# Patient Record
Sex: Male | Born: 1955 | Race: White | Hispanic: No | Marital: Single | Smoking: Former smoker
Health system: Southern US, Community
[De-identification: ages and names within clinical notes are randomized; demographics above are authoritative.]

## PROBLEM LIST (undated history)

## (undated) ENCOUNTER — Emergency Department (HOSPITAL_COMMUNITY)
Admission: EM | Discharge status: Against Medical Advice | Payer: PRIVATE HEALTH INSURANCE | Attending: Emergency Medicine | Admitting: Emergency Medicine

## (undated) DIAGNOSIS — R739 Hyperglycemia, unspecified: Secondary | ICD-10-CM

## (undated) DIAGNOSIS — B029 Zoster without complications: Secondary | ICD-10-CM

## (undated) DIAGNOSIS — E785 Hyperlipidemia, unspecified: Secondary | ICD-10-CM

## (undated) DIAGNOSIS — I10 Essential (primary) hypertension: Secondary | ICD-10-CM

## (undated) DIAGNOSIS — A048 Other specified bacterial intestinal infections: Secondary | ICD-10-CM

## (undated) DIAGNOSIS — N529 Male erectile dysfunction, unspecified: Secondary | ICD-10-CM

## (undated) DIAGNOSIS — K635 Polyp of colon: Secondary | ICD-10-CM

## (undated) DIAGNOSIS — N451 Epididymitis: Secondary | ICD-10-CM

## (undated) DIAGNOSIS — K2970 Gastritis, unspecified, without bleeding: Secondary | ICD-10-CM

## (undated) DIAGNOSIS — M1990 Unspecified osteoarthritis, unspecified site: Secondary | ICD-10-CM

## (undated) DIAGNOSIS — F40240 Claustrophobia: Secondary | ICD-10-CM

## (undated) DIAGNOSIS — E039 Hypothyroidism, unspecified: Secondary | ICD-10-CM

## (undated) DIAGNOSIS — G4730 Sleep apnea, unspecified: Secondary | ICD-10-CM

## (undated) DIAGNOSIS — E7800 Pure hypercholesterolemia, unspecified: Secondary | ICD-10-CM

## (undated) HISTORY — DX: Hyperglycemia, unspecified: R73.9

## (undated) HISTORY — DX: Pure hypercholesterolemia, unspecified: E78.00

## (undated) HISTORY — PX: FRACTURE SURGERY: SHX138

## (undated) HISTORY — PX: TONSILLECTOMY: SHX5217

## (undated) HISTORY — DX: Gilbert syndrome: E80.4

## (undated) HISTORY — DX: Male erectile dysfunction, unspecified: N52.9

## (undated) HISTORY — DX: Claustrophobia: F40.240

## (undated) HISTORY — DX: Unspecified osteoarthritis, unspecified site: M19.90

## (undated) HISTORY — DX: Epididymitis: N45.1

## (undated) HISTORY — PX: OTHER SURGICAL HISTORY: SHX169

## (undated) HISTORY — DX: Hypothyroidism, unspecified: E03.9

## (undated) HISTORY — DX: Sleep apnea, unspecified: G47.30

## (undated) HISTORY — DX: Gastritis, unspecified, without bleeding: K29.70

## (undated) HISTORY — DX: Polyp of colon: K63.5

## (undated) HISTORY — PX: VENTRAL HERNIA REPAIR: SHX424

## (undated) HISTORY — DX: Hyperlipidemia, unspecified: E78.5

## (undated) HISTORY — DX: Other specified bacterial intestinal infections: A04.8

## (undated) HISTORY — DX: Zoster without complications: B02.9

---

## 2000-05-19 ENCOUNTER — Encounter: Payer: Self-pay | Admitting: Cardiology

## 2000-06-12 HISTORY — PX: OTHER SURGICAL HISTORY: SHX169

## 2004-04-30 ENCOUNTER — Ambulatory Visit: Payer: Self-pay | Admitting: Family Medicine

## 2004-04-30 LAB — CONVERTED CEMR LAB: Hgb A1c MFr Bld: 5.4 %

## 2004-05-04 ENCOUNTER — Ambulatory Visit: Payer: Self-pay | Admitting: Family Medicine

## 2004-10-25 ENCOUNTER — Encounter: Payer: Self-pay | Admitting: Family Medicine

## 2004-10-25 LAB — CONVERTED CEMR LAB: PSA: 0.57 ng/mL

## 2004-10-28 ENCOUNTER — Ambulatory Visit: Payer: Self-pay | Admitting: Family Medicine

## 2004-11-02 ENCOUNTER — Ambulatory Visit: Payer: Self-pay | Admitting: Family Medicine

## 2005-01-14 ENCOUNTER — Ambulatory Visit: Payer: Self-pay | Admitting: Family Medicine

## 2006-01-06 ENCOUNTER — Ambulatory Visit: Payer: Self-pay | Admitting: Family Medicine

## 2006-01-10 ENCOUNTER — Ambulatory Visit: Payer: Self-pay | Admitting: Family Medicine

## 2006-07-11 ENCOUNTER — Ambulatory Visit: Payer: Self-pay | Admitting: Family Medicine

## 2006-07-13 ENCOUNTER — Ambulatory Visit: Payer: Self-pay | Admitting: Cardiology

## 2006-07-25 ENCOUNTER — Ambulatory Visit: Payer: Self-pay | Admitting: Family Medicine

## 2006-07-26 ENCOUNTER — Ambulatory Visit: Payer: Self-pay

## 2006-07-26 HISTORY — PX: OTHER SURGICAL HISTORY: SHX169

## 2007-01-15 ENCOUNTER — Ambulatory Visit: Payer: Self-pay | Admitting: Family Medicine

## 2007-01-15 DIAGNOSIS — A048 Other specified bacterial intestinal infections: Secondary | ICD-10-CM | POA: Insufficient documentation

## 2007-01-15 DIAGNOSIS — K2990 Gastroduodenitis, unspecified, without bleeding: Secondary | ICD-10-CM

## 2007-01-15 DIAGNOSIS — K2970 Gastritis, unspecified, without bleeding: Secondary | ICD-10-CM | POA: Insufficient documentation

## 2007-01-15 DIAGNOSIS — R7309 Other abnormal glucose: Secondary | ICD-10-CM | POA: Insufficient documentation

## 2007-01-15 LAB — CONVERTED CEMR LAB
ALT: 23 units/L (ref 0–53)
AST: 18 units/L (ref 0–37)
Albumin: 4.3 g/dL (ref 3.5–5.2)
BUN: 17 mg/dL (ref 6–23)
Bilirubin, Direct: 0.3 mg/dL (ref 0.0–0.3)
Calcium: 8.8 mg/dL (ref 8.4–10.5)
Chloride: 107 meq/L (ref 96–112)
GFR calc Af Amer: 91 mL/min
GFR calc non Af Amer: 75 mL/min
Glucose, Bld: 112 mg/dL — ABNORMAL HIGH (ref 70–99)
LDL Cholesterol: 132 mg/dL — ABNORMAL HIGH (ref 0–99)
TSH: 4.9 microintl units/mL (ref 0.35–5.50)
Total CHOL/HDL Ratio: 4.4
VLDL: 17 mg/dL (ref 0–40)

## 2007-01-16 DIAGNOSIS — E039 Hypothyroidism, unspecified: Secondary | ICD-10-CM | POA: Insufficient documentation

## 2007-01-17 ENCOUNTER — Ambulatory Visit: Payer: Self-pay | Admitting: Family Medicine

## 2007-01-30 ENCOUNTER — Ambulatory Visit: Payer: Self-pay | Admitting: Family Medicine

## 2007-01-31 ENCOUNTER — Encounter (INDEPENDENT_AMBULATORY_CARE_PROVIDER_SITE_OTHER): Payer: Self-pay | Admitting: *Deleted

## 2007-02-12 ENCOUNTER — Ambulatory Visit: Payer: Self-pay | Admitting: Family Medicine

## 2007-02-19 ENCOUNTER — Ambulatory Visit: Payer: Self-pay | Admitting: Internal Medicine

## 2007-03-22 ENCOUNTER — Encounter: Payer: Self-pay | Admitting: Internal Medicine

## 2007-03-22 ENCOUNTER — Ambulatory Visit: Payer: Self-pay | Admitting: Internal Medicine

## 2007-03-22 ENCOUNTER — Encounter: Payer: Self-pay | Admitting: Family Medicine

## 2007-03-22 HISTORY — PX: COLONOSCOPY: SHX174

## 2007-09-17 ENCOUNTER — Ambulatory Visit: Payer: Self-pay | Admitting: Family Medicine

## 2007-09-17 DIAGNOSIS — F40298 Other specified phobia: Secondary | ICD-10-CM | POA: Insufficient documentation

## 2007-09-17 DIAGNOSIS — N529 Male erectile dysfunction, unspecified: Secondary | ICD-10-CM | POA: Insufficient documentation

## 2007-09-27 DIAGNOSIS — D126 Benign neoplasm of colon, unspecified: Secondary | ICD-10-CM | POA: Insufficient documentation

## 2008-01-18 ENCOUNTER — Ambulatory Visit: Payer: Self-pay | Admitting: Family Medicine

## 2008-01-18 LAB — CONVERTED CEMR LAB
Basophils Absolute: 0 10*3/uL (ref 0.0–0.1)
Basophils Relative: 0.7 % (ref 0.0–3.0)
Bilirubin, Direct: 0.2 mg/dL (ref 0.0–0.3)
Calcium: 8.9 mg/dL (ref 8.4–10.5)
Cholesterol: 167 mg/dL (ref 0–200)
Creatinine, Ser: 0.9 mg/dL (ref 0.4–1.5)
Creatinine,U: 139.5 mg/dL
Eosinophils Absolute: 0.3 10*3/uL (ref 0.0–0.7)
Free T4: 0.7 ng/dL (ref 0.6–1.6)
GFR calc non Af Amer: 95 mL/min
Hemoglobin: 14.3 g/dL (ref 13.0–17.0)
LDL Cholesterol: 110 mg/dL — ABNORMAL HIGH (ref 0–99)
MCHC: 35 g/dL (ref 30.0–36.0)
MCV: 91 fL (ref 78.0–100.0)
Microalb Creat Ratio: 5.7 mg/g (ref 0.0–30.0)
Microalb, Ur: 0.8 mg/dL (ref 0.0–1.9)
Neutro Abs: 1.9 10*3/uL (ref 1.4–7.7)
Neutrophils Relative %: 50.9 % (ref 43.0–77.0)
PSA: 0.66 ng/mL (ref 0.10–4.00)
RBC: 4.49 M/uL (ref 4.22–5.81)
RDW: 12.9 % (ref 11.5–14.6)
Sodium: 140 meq/L (ref 135–145)
Total Bilirubin: 2.1 mg/dL — ABNORMAL HIGH (ref 0.3–1.2)
Triglycerides: 52 mg/dL (ref 0–149)
VLDL: 10 mg/dL (ref 0–40)

## 2008-01-24 ENCOUNTER — Ambulatory Visit: Payer: Self-pay | Admitting: Family Medicine

## 2008-04-21 ENCOUNTER — Ambulatory Visit: Payer: Self-pay | Admitting: Family Medicine

## 2008-04-21 DIAGNOSIS — M549 Dorsalgia, unspecified: Secondary | ICD-10-CM | POA: Insufficient documentation

## 2008-12-02 DIAGNOSIS — R079 Chest pain, unspecified: Secondary | ICD-10-CM | POA: Insufficient documentation

## 2009-02-09 ENCOUNTER — Ambulatory Visit: Payer: Self-pay | Admitting: Family Medicine

## 2009-02-09 LAB — CONVERTED CEMR LAB
ALT: 20 units/L (ref 0–53)
BUN: 15 mg/dL (ref 6–23)
Basophils Absolute: 0 10*3/uL (ref 0.0–0.1)
Calcium: 9.2 mg/dL (ref 8.4–10.5)
Cholesterol: 185 mg/dL (ref 0–200)
Creatinine, Ser: 1 mg/dL (ref 0.4–1.5)
Eosinophils Absolute: 0.3 10*3/uL (ref 0.0–0.7)
GFR calc non Af Amer: 83.11 mL/min (ref >60)
HDL: 51.8 mg/dL (ref >39.00)
Hemoglobin: 14.4 g/dL (ref 13.0–17.0)
LDL Cholesterol: 123 mg/dL — ABNORMAL HIGH (ref 0–99)
Lymphocytes Relative: 41.2 % (ref 12.0–46.0)
Lymphs Abs: 1.6 10*3/uL (ref 0.7–4.0)
MCHC: 34.1 g/dL (ref 30.0–36.0)
Neutro Abs: 1.7 10*3/uL (ref 1.4–7.7)
RDW: 13.1 % (ref 11.5–14.6)
TSH: 10.89 microintl units/mL — ABNORMAL HIGH (ref 0.35–5.50)
Total Bilirubin: 2.1 mg/dL — ABNORMAL HIGH (ref 0.3–1.2)
Triglycerides: 52 mg/dL (ref 0.0–149.0)

## 2009-02-16 ENCOUNTER — Ambulatory Visit: Payer: Self-pay | Admitting: Family Medicine

## 2009-05-25 ENCOUNTER — Ambulatory Visit: Payer: Self-pay | Admitting: Family Medicine

## 2009-05-28 ENCOUNTER — Ambulatory Visit: Payer: Self-pay | Admitting: Family Medicine

## 2009-05-28 DIAGNOSIS — M79609 Pain in unspecified limb: Secondary | ICD-10-CM | POA: Insufficient documentation

## 2009-07-22 ENCOUNTER — Telehealth: Payer: Self-pay | Admitting: Family Medicine

## 2009-08-24 ENCOUNTER — Ambulatory Visit: Payer: Self-pay | Admitting: Family Medicine

## 2009-08-24 LAB — CONVERTED CEMR LAB: TSH: 1.82 microintl units/mL (ref 0.35–5.50)

## 2009-08-27 ENCOUNTER — Ambulatory Visit: Payer: Self-pay | Admitting: Family Medicine

## 2009-10-29 ENCOUNTER — Ambulatory Visit: Payer: Self-pay | Admitting: Family Medicine

## 2009-10-29 DIAGNOSIS — R198 Other specified symptoms and signs involving the digestive system and abdomen: Secondary | ICD-10-CM | POA: Insufficient documentation

## 2009-10-29 LAB — CONVERTED CEMR LAB
ALT: 21 units/L (ref 0–53)
Albumin: 4.6 g/dL (ref 3.5–5.2)
Basophils Relative: 0.5 % (ref 0.0–3.0)
CO2: 32 meq/L (ref 19–32)
Chloride: 103 meq/L (ref 96–112)
Eosinophils Absolute: 0.1 10*3/uL (ref 0.0–0.7)
HCT: 44.5 % (ref 39.0–52.0)
Hemoglobin: 15.2 g/dL (ref 13.0–17.0)
MCHC: 34.2 g/dL (ref 30.0–36.0)
MCV: 90.9 fL (ref 78.0–100.0)
Monocytes Absolute: 0.3 10*3/uL (ref 0.1–1.0)
Neutro Abs: 2.4 10*3/uL (ref 1.4–7.7)
RBC: 4.9 M/uL (ref 4.22–5.81)
Sodium: 142 meq/L (ref 135–145)

## 2009-11-12 ENCOUNTER — Ambulatory Visit: Payer: Self-pay | Admitting: Internal Medicine

## 2009-11-19 ENCOUNTER — Telehealth: Payer: Self-pay | Admitting: Family Medicine

## 2009-12-30 ENCOUNTER — Telehealth: Payer: Self-pay | Admitting: Family Medicine

## 2010-03-08 ENCOUNTER — Telehealth: Payer: Self-pay | Admitting: Family Medicine

## 2010-07-27 NOTE — Assessment & Plan Note (Signed)
Summary: DISCUSS WEIGHT LOSS AND MEDICATION/DLO   Vital Signs:  Patient profile:   55 year old male Weight:      149.75 pounds Temp:     98.6 degrees F oral Pulse rate:   68 / minute Pulse rhythm:   regular BP sitting:   122 / 88  (left arm) Cuff size:   regular  Vitals Entered By: Sydell Axon LPN (Oct 29, 8117 10:10 AM) CC: Discuss his weight loss and current medications   History of Present Illness: Since seeing me last time, has started losing weight. He had early satiety but that is now better. He has lost 16 pounds. He has no abd pain. He got separated again and has anxiety and continues. He is heading for divorce, her input, doesn't know if she is involved. Holidays were nml to him and the [place] after, she left and had arrangements in place. He is sleeping ok, he feels tired some days. He sees no blood in stool, is brown and formed and has had regularity of BMs.   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 One Tab By Mouth Once Daily 2)  Viagra 100 Mg  Tabs (Sildenafil Citrate) .... One Tab By Mouth One Hour Prior As Needed.  Allergies: No Known Drug Allergies  Physical Exam  General:   Well-developed,well-nourished,in no acute distress; alert,appropriate and cooperative throughout examination, looks thin. Head:  Normocephalic and atraumatic without obvious abnormalities. No apparent alopecia but mild receding hairline of male pattern balding. Sinuses NT. Eyes:  Conjunctiva clear bilaterally.  Ears:  External ear exam shows no significant lesions or deformities.  Otoscopic examination reveals clear canals, tympanic membranes are intact bilaterally without bulging, retraction, inflammation or discharge. Hearing is grossly normal bilaterally. Nose:  External nasal examination shows no deformity or inflammation. Nasal mucosa are pink and moist without lesions or exudates. Mouth:  Oral mucosa and oropharynx without lesions or exudates.  Teeth in good repair. Neck:  No deformities, masses, or tenderness noted. Chest Wall:  No deformities, masses, tenderness or gynecomastia noted. Lungs:  Normal respiratory effort, chest expands symmetrically. Lungs are clear to auscultation, no crackles or wheezes. Heart:  Normal rate and regular rhythm. S1 and S2 normal without gallop, murmur, click, rub or other extra sounds. Abdomen:  Bowel sounds positive,abdomen soft and non-tender without masses, organomegaly or hernias noted.   Impression & Recommendations:  Problem # 1:  WEIGHT LOSS, RECENT (ICD-783.21) Assessment New [place] be all anxiety and depression but will investigate. Labs today, refer to GI. Orders: Gastroenterology Referral (GI) Venipuncture (14782) TLB-BMP (Basic Metabolic Panel-BMET) (80048-METABOL) TLB-CBC Platelet - w/Differential (85025-CBCD) TLB-TSH (Thyroid Stimulating Hormone) (84443-TSH) TLB-Hepatic/Liver Function Pnl (80076-HEPATIC)  Problem # 2:  EARLY SATIETY (ICD-789.9) Assessment: New Refer to GI. Orders: Gastroenterology Referral (  GI) Venipuncture (52841) TLB-BMP (Basic Metabolic Panel-BMET) (80048-METABOL) TLB-CBC Platelet - w/Differential  (85025-CBCD) TLB-TSH (Thyroid Stimulating Hormone) (84443-TSH) TLB-Hepatic/Liver Function Pnl (80076-HEPATIC)  Complete Medication List: 1)  Synthroid 175 Mcg Tabs (Levothyroxine sodium) .... One tab by mouth once daily 2)  Viagra 100 Mg Tabs (Sildenafil citrate) .... One tab by mouth one hour prior as needed.  Patient Instructions: 1)  Refer to GI 2)  Will report labs via phone tree unless abnml. 3)  See me after GI complete.  Current Allergies (reviewed today): No known allergies

## 2010-07-27 NOTE — Progress Notes (Signed)
Summary: wants to try cialis  Phone Note Call from Patient Call back at [phone]   Caller: Patient Summary of Call: Pt has been using viagra for a couple of years but would like to try daily cialis because he has a coupon.  He wants a 30 day supply sent to [facility] in [place]. Initial call taken by: Lowella Petties CMA,  December 30, 2009 12:35 PM  Follow-up for Phone Call        please send in rx for cialis 5mg  by mouth qday.  #30, 1rf.  Do not use with viagra.  Follow-up by: Crawford Givens MD,  December 30, 2009 2:01 PM    New/Updated Medications: CIALIS 5 MG TABS (TADALAFIL) Take 1 tablet by mouth once a day Prescriptions: CIALIS 5 MG TABS (TADALAFIL) Take 1 tablet by mouth once a day  #30 x 1   Entered by:   Delilah Shan CMA (AAMA)   Authorized by:   Crawford Givens MD   Signed by:   Delilah Shan CMA (AAMA) on 12/30/2009   Method used:   Electronically to        [facility] [address] (retail)       [address] [place], [place]  [address]       Ph: [phone]       Fax: [phone]   RxID:   [id number]

## 2010-07-27 NOTE — Progress Notes (Signed)
Summary: Rx Viagra  Phone Note Call from Patient Call back at [phone]   Caller: Patient Call For: Shaune Leeks MD Summary of Call: Patient called requesting a Rx for Viagra.  Please advise.  [facility]ses [facility]. Initial call taken by: Linde Gillis CMA Duncan Dull),  July 22, 2009 2:27 PM  Follow-up for Phone Call        Patient notified Follow-up by: Linde Gillis CMA Duncan Dull),  July 22, 2009 3:18 PM    Prescriptions: VIAGRA 100 MG  TABS (SILDENAFIL CITRATE) one tab by mouth one hour prior as needed.  #10 x 12   Entered and Authorized by:   Shaune Leeks MD   Signed by:   Shaune Leeks MD on 07/22/2009   Method used:   Electronically to        [facility] [address] (retail)       [address] [place], [place]  [address]       Ph: [phone]       Fax: [phone]   RxID:   [id number]

## 2010-07-27 NOTE — Progress Notes (Signed)
Summary: refill request for cialis  Phone Note Refill Request Message from:  Fax from Pharmacy  Refills Requested: Medication #1:  CIALIS 5 MG TABS Take 1 tablet by mouth once a day.   Last Refilled: 01/30/2010 Faxed request from [facility] [place].  Initial call taken by: Lowella Petties CMA,  March 08, 2010 11:17 AM  Follow-up for Phone Call       Follow-up by: Crawford Givens MD,  March 08, 2010 1:56 PM    Prescriptions: CIALIS 5 MG TABS (TADALAFIL) Take 1 tablet by mouth once a day  #30 x 5   Entered and Authorized by:   Crawford Givens MD   Signed by:   Crawford Givens MD on 03/08/2010   Method used:   Electronically to        [facility] [address] (retail)       [address] [place], [place]  [address]       Ph: [phone]       Fax: [phone]   RxID:   [id number]

## 2010-07-27 NOTE — Assessment & Plan Note (Signed)
Summary: WEIGHT LOSS    History of Present Illness Visit Type: Follow-up Visit Primary GI MD: Yancey Flemings MD Primary Provider: Laurita Quint, MD Requesting Provider: Laurita Quint, MD Chief Complaint: Weight loss x 1 month History of Present Illness:   55 year old white male with a history of hyperlipidemia, hypothyroidism, and Gilbert's syndrome. He is sent today regarding weight loss. The patient was evaluated in 2008 4 low her abdominal discomfort and consideration of colonoscopy. Complete colonoscopy was performed March 22, 2007. The examination was entirely normal except for diminutive sigmoid colon polyps which were removed and found to be hyperplastic. Follow up in 10 years recommended. The patient states that he was in his usual state of health until January when he came under considerable stress do to the separation with his wife. Stress continued. He reports 16 pound weight loss in March and April. He denies change in appetite. There was some mild intermittent early satiety. GI review of systems is otherwise negative. No back pain. His thyroid condition is stable. Review of outside blood work from 2 weeks ago reveals unremarkable CBC and conference of metabolic panel. Elevated indirect bilirubin as expected. Normal TSH. He tells me that over the past few weeks his situational issues have improved. He is asked again for pounds over the past week.   GI Review of Systems    Reports weight loss and  weight gain.   Weight loss of 16 pounds over 2 months.   Denies abdominal pain, acid reflux, belching, bloating, chest pain, dysphagia with liquids, dysphagia with solids, heartburn, loss of appetite, vomiting, and  vomiting blood.        Denies anal fissure, black tarry stools, change in bowel habit, constipation, diarrhea, diverticulosis, fecal incontinence, heme positive stool, hemorrhoids, irritable bowel syndrome, jaundice, light color stool, liver problems, rectal bleeding, and   rectal pain.    Current Medications (verified): 1)  Synthroid 175 Mcg Tabs (Levothyroxine Sodium) .... One Tab By Mouth Once Daily 2)  Viagra 100 Mg  Tabs (Sildenafil Citrate) .... One Tab By Mouth One Hour Prior As Needed.  Allergies (verified): No Known Drug Allergies  Past History:  Past Medical History: Reviewed history from 12/02/2008 and no changes required. 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 Ventral Hernia Repair  as child Tonsillectomy  as child Fx L Leg as child HOSP Gastritis in [place] ([place] ETT, negative ([facility]) 06/12/00 Stress myoview, NSST/T W changes, low risk 07/26/06 Colonoscopy Benign polyps (Dr Marina Goodell) 03/22/07                 10 yrs  Family History: Reviewed history from 02/16/2009 and no changes required. Father: A  54 Mother: A 71 osteoporosis, DM Siblings: Only child CV- + PGF MI HBP- + Mother's side + Stroke mother's side  Social History: Reviewed history from 12/02/2008 and no changes required. Marital Status: Married Children: One daughter Occupation: Manufacturing systems engineer with [employer] Tobacco Use - Former.  - 1988 Alcohol Use - yes Regular Exercise - yes  Drug Use - yes last in '76  Review of Systems       The patient complains of anxiety-new.  The patient denies allergy/sinus,  anemia, arthritis/joint pain, back pain, blood in urine, breast changes/lumps, change in vision, confusion,  cough, coughing up blood, depression-new, fainting, fatigue, fever, headaches-new, hearing problems, heart murmur, heart rhythm changes, itching, menstrual pain, muscle pains/cramps, night sweats, nosebleeds, pregnancy symptoms, shortness of breath, skin rash, sleeping problems, sore throat, swelling of feet/legs, swollen lymph glands, thirst - excessive , urination - excessive , urination changes/pain, urine leakage, vision changes, and voice change.    Vital Signs:  Patient profile:   55 year old male Height:      67 inches Weight:      151.25 pounds BMI:     23.77 Pulse rate:   68 / minute Pulse rhythm:   regular BP sitting:   116 / 80  (left arm) Cuff size:   regular  Vitals Entered By: June McMurray CMA Duncan Dull) (Nov 12, 2009 10:02 AM)  Physical Exam  General:  Well developed, well nourished, no acute distress. Head:  Normocephalic and atraumatic. Eyes:  PERRLA, no icterus. Ears:  Normal auditory acuity. Nose:  No deformity, discharge,  or lesions. Mouth:  No deformity or lesions, dentition normal. Neck:  Supple; no masses or thyromegaly. Lungs:  Clear throughout to auscultation. Heart:  Regular rate and rhythm; no murmurs, rubs,  or bruits. Abdomen:  Soft, nontender and nondistended. No masses, hepatosplenomegaly or hernias noted. Normal bowel sounds. Msk:  Symmetrical with no gross deformities. Normal posture. Pulses:  Normal pulses noted. Extremities:  No clubbing, cyanosis, edema or deformities noted. Neurologic:  Alert and  oriented x4;  grossly normal neurologically. Skin:  Intact without significant lesions or rashes. Psych:  Alert and cooperative. Normal mood and affect.   Impression & Recommendations:  Problem # 1:  WEIGHT LOSS, RECENT (ICD-69.75) 55 year-old who presents with weight loss as described. His GI review of systems  negative. The most likely explanation is situational weight loss related to anxiety. With improving personal issues, he has had some weight  gain. Previous negative colonoscopy and recent laboratories as described. At this point, I do not think further workup is warranted, assuming that the patient continues to be asymptomatic and continues to gain, or at least not lose, weight. I discussed this with him in detail. I will assume that he is doing well unless I hear otherwise.  Patient Instructions: 1)  Please schedule a follow-up appointment as needed.  2)  Copy sent to : Laurita Quint, MD 3)  The medication list was reviewed and reconciled.  All changed / newly prescribed medications were explained.  A complete medication list was provided to the patient / caregiver. 4)  printed and given to pt. Milford Cage NCMA  Nov 12, 2009 10:54 AM

## 2010-07-27 NOTE — Assessment & Plan Note (Signed)
Summary: 3 MONTH FOLLOW UP/[facility]   Vital Signs:  Patient profile:   55 year old male Weight:      162 pounds Temp:     98.9 degrees F oral Pulse rate:   60 / minute Pulse rhythm:   regular BP sitting:   140 / 84  (left arm) Cuff size:   regular  Vitals Entered By: Sydell Axon LPN (August 27, 452 11:36 AM) CC: 3 Month follow-up after labs   History of Present Illness: Pt here for another three month followup after having increased his thyroid replacement now 6 months ago. He feel slightly better and has no complaints today. His TSH was still elevated three months agao after increasing his medicine 3 mos prior.   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 One Tab By Mouth Once Daily 2)  Viagra 100 Mg  Tabs (Sildenafil Citrate) .... One Tab By Mouth One Hour Prior As Needed.  Allergies: No Known Drug Allergies  Physical Exam  General:  Well-developed,well-nourished,in no acute distress; alert,appropriate and cooperative throughout examination Head:  Normocephalic and atraumatic without obvious abnormalities. No apparent alopecia but mild receding hairline of male pattern balding. Eyes:  Conjunctiva clear bilaterally.   Ears:  External ear exam shows no significant lesions or deformities.  Otoscopic examination reveals clear canals, tympanic membranes are intact bilaterally without bulging, retraction, inflammation or discharge. Hearing is grossly normal bilaterally. Nose:  External nasal examination shows no deformity or inflammation. Nasal mucosa are pink and moist without lesions or exudates. Mouth:  Oral mucosa and oropharynx without lesions or exudates.  Teeth in good repair. Neck:  No deformities, masses, or tenderness noted. Chest Wall:  No deformities, masses, tenderness or gynecomastia noted. Lungs:  Normal respiratory effort, chest expands symmetrically. Lungs are clear to auscultation, no crackles or wheezes. Heart:  Normal rate and regular rhythm. S1 and S2 normal without gallop, murmur, click, rub or other extra sounds.   Impression & Recommendations:  Problem # 1:  HYPOTHYROIDISM NOS (ICD-244.9) Assessment Improved Therapeutic on current dose after 6 mos. Cont as prescribed. His updated medication list for this problem includes:    Synthroid 175 Mcg Tabs (Levothyroxine sodium) ..... One tab by mouth once daily  Labs Reviewed: TSH: 1.82 (08/24/2009)    HgBA1c: 5.4 (04/30/2004) Chol: 185 (02/09/2009)   HDL: 51.80 (02/09/2009)   LDL: 123 (02/09/2009)   TG: 52.0 (02/09/2009)  Complete Medication List: 1)  Synthroid 175 Mcg Tabs (Levothyroxine sodium) .... One tab by mouth once daily 2)  Viagra 100 Mg Tabs (Sildenafil citrate) .... One tab by mouth one hour prior as needed.  Patient Instructions: 1)  RTC about this time next year for Comp Exam, fasting labs prior.  Current Allergies (reviewed today): No known allergies

## 2010-07-27 NOTE — Progress Notes (Signed)
Summary: refill request for alprazolam  Phone Note Refill Request Message from:  Fax from Pharmacy  Refills Requested: Medication #1:  alprazolam 0.5 mg   Last Refilled: 09/17/2007 Faxed request from [facility] in [place], this is no longer on med list.  [phone].  Initial call taken by: Lowella Petties CMA,  Nov 19, 2009 10:42 AM  Follow-up for Phone Call        Called to [facility]. Follow-up by: Lowella Petties CMA,  Nov 19, 2009 4:51 PM    New/Updated Medications: ALPRAZOLAM 0.5 MG XR24H-TAB (ALPRAZOLAM) one tab by mouth once a day as needed for agitation. Prescriptions: ALPRAZOLAM 0.5 MG XR24H-TAB (ALPRAZOLAM) one tab by mouth once a day as needed for agitation.  #30 x 0   Entered and Authorized by:   Shaune Leeks MD   Signed by:   Lowella Petties CMA on 11/19/2009   Method used:   Telephoned to ...       [facility] [address] (retail)       [address] [place], [place]  [address]       Ph: [phone]       Fax: [phone]   RxID:   [id number]

## 2010-08-04 ENCOUNTER — Encounter: Payer: Self-pay | Admitting: Family Medicine

## 2010-09-17 ENCOUNTER — Other Ambulatory Visit: Payer: Self-pay | Admitting: Family Medicine

## 2010-09-17 ENCOUNTER — Other Ambulatory Visit: Payer: Self-pay | Admitting: *Deleted

## 2010-09-17 DIAGNOSIS — K2970 Gastritis, unspecified, without bleeding: Secondary | ICD-10-CM

## 2010-09-17 DIAGNOSIS — D126 Benign neoplasm of colon, unspecified: Secondary | ICD-10-CM

## 2010-09-17 DIAGNOSIS — E039 Hypothyroidism, unspecified: Secondary | ICD-10-CM

## 2010-09-17 DIAGNOSIS — R079 Chest pain, unspecified: Secondary | ICD-10-CM

## 2010-09-17 DIAGNOSIS — R7309 Other abnormal glucose: Secondary | ICD-10-CM

## 2010-09-17 DIAGNOSIS — Z125 Encounter for screening for malignant neoplasm of prostate: Secondary | ICD-10-CM

## 2010-09-17 DIAGNOSIS — K2990 Gastroduodenitis, unspecified, without bleeding: Secondary | ICD-10-CM

## 2010-09-17 NOTE — Patient Instructions (Signed)
Orders sent to lab for bloodwork.

## 2010-09-20 ENCOUNTER — Other Ambulatory Visit (INDEPENDENT_AMBULATORY_CARE_PROVIDER_SITE_OTHER): Payer: PRIVATE HEALTH INSURANCE | Admitting: Family Medicine

## 2010-09-20 DIAGNOSIS — Z0000 Encounter for general adult medical examination without abnormal findings: Secondary | ICD-10-CM

## 2010-09-20 DIAGNOSIS — M545 Low back pain, unspecified: Secondary | ICD-10-CM

## 2010-09-20 DIAGNOSIS — E039 Hypothyroidism, unspecified: Secondary | ICD-10-CM

## 2010-09-20 DIAGNOSIS — Z125 Encounter for screening for malignant neoplasm of prostate: Secondary | ICD-10-CM

## 2010-09-20 DIAGNOSIS — K2970 Gastritis, unspecified, without bleeding: Secondary | ICD-10-CM

## 2010-09-20 DIAGNOSIS — D126 Benign neoplasm of colon, unspecified: Secondary | ICD-10-CM

## 2010-09-20 DIAGNOSIS — R7309 Other abnormal glucose: Secondary | ICD-10-CM

## 2010-09-20 LAB — MICROALBUMIN / CREATININE URINE RATIO
Creatinine,U: 188.7 mg/dL
Microalb Creat Ratio: 3.7 mg/g (ref 0.0–30.0)
Microalb, Ur: 6.9 mg/dL — ABNORMAL HIGH (ref 0.0–1.9)

## 2010-09-20 LAB — LIPID PANEL
HDL: 56.5 mg/dL (ref >39.00)
LDL Cholesterol: 126 mg/dL — ABNORMAL HIGH (ref 0–99)
Total CHOL/HDL Ratio: 3
Triglycerides: 47 mg/dL (ref 0.0–149.0)
VLDL: 9.4 mg/dL (ref 0.0–40.0)

## 2010-09-20 LAB — TSH: TSH: 2.72 u[IU]/mL (ref 0.35–5.50)

## 2010-09-20 LAB — CBC WITH DIFFERENTIAL/PLATELET
Basophils Relative: 0.5 % (ref 0.0–3.0)
Eosinophils Relative: 5.3 % — ABNORMAL HIGH (ref 0.0–5.0)
Lymphocytes Relative: 27.6 % (ref 12.0–46.0)
Monocytes Relative: 6.2 % (ref 3.0–12.0)
Neutrophils Relative %: 60.4 % (ref 43.0–77.0)
Platelets: 235 10*3/uL (ref 150.0–400.0)
RBC: 4.77 Mil/uL (ref 4.22–5.81)
WBC: 5.4 10*3/uL (ref 4.5–10.5)

## 2010-09-20 LAB — BASIC METABOLIC PANEL
Chloride: 105 mEq/L (ref 96–112)
Creatinine, Ser: 0.9 mg/dL (ref 0.4–1.5)
GFR: 89.82 mL/min (ref >60.00)

## 2010-09-20 LAB — HEPATIC FUNCTION PANEL
ALT: 24 U/L (ref 0–53)
Bilirubin, Direct: 0.3 mg/dL (ref 0.0–0.3)
Total Bilirubin: 2.4 mg/dL — ABNORMAL HIGH (ref 0.3–1.2)

## 2010-09-20 LAB — T4, FREE: Free T4: 0.73 ng/dL (ref 0.60–1.60)

## 2010-09-20 NOTE — Progress Notes (Signed)
Addended byMills Koller on: 09/20/2010 09:49 AM   Modules accepted: Orders

## 2010-09-21 LAB — VITAMIN D 25 HYDROXY (VIT D DEFICIENCY, FRACTURES): Vit D, 25-Hydroxy: 36 ng/mL (ref 30–89)

## 2010-09-23 ENCOUNTER — Ambulatory Visit (INDEPENDENT_AMBULATORY_CARE_PROVIDER_SITE_OTHER): Payer: PRIVATE HEALTH INSURANCE | Admitting: Family Medicine

## 2010-09-23 ENCOUNTER — Encounter: Payer: Self-pay | Admitting: Family Medicine

## 2010-09-23 DIAGNOSIS — R7309 Other abnormal glucose: Secondary | ICD-10-CM

## 2010-09-23 DIAGNOSIS — R030 Elevated blood-pressure reading, without diagnosis of hypertension: Secondary | ICD-10-CM

## 2010-09-23 DIAGNOSIS — E039 Hypothyroidism, unspecified: Secondary | ICD-10-CM

## 2010-09-23 DIAGNOSIS — R079 Chest pain, unspecified: Secondary | ICD-10-CM

## 2010-09-23 DIAGNOSIS — I10 Essential (primary) hypertension: Secondary | ICD-10-CM | POA: Insufficient documentation

## 2010-09-23 DIAGNOSIS — IMO0001 Reserved for inherently not codable concepts without codable children: Secondary | ICD-10-CM

## 2010-09-23 NOTE — Assessment & Plan Note (Signed)
Bili still elevated, LFTs otherwise nml.

## 2010-09-23 NOTE — Patient Instructions (Signed)
RTC 6 weeks for reassessment of BP.  Avoid salt.

## 2010-09-23 NOTE — Progress Notes (Signed)
  Subjective:    Patient ID: Chad Avila, male    DOB: 1955/12/31, 55 y.o.   MRN: [id number]  HPI Pt here for Comp Exam. He got stuffiness after his flu shot ladst Fall which has never gone away. He otherwise feels well. He is very stiff nowadays with joints bothered the most.     Review of Systems  Constitutional: Negative for fever, chills, diaphoresis, appetite change, fatigue and unexpected weight change.  HENT: Negative for hearing loss, ear pain, tinnitus and ear discharge.   Eyes: Negative for pain, discharge and visual disturbance.  Respiratory: Negative for cough, shortness of breath and wheezing.   Cardiovascular: Negative for chest pain and palpitations.       No SOB w/ exertion  Gastrointestinal: Negative for nausea, vomiting, abdominal pain, diarrhea, constipation, blood in stool and anal bleeding.       No heartburn or swallowing problems.  Genitourinary: Negative for dysuria, frequency and difficulty urinating.       Nocturia a few times at night regularly.  Musculoskeletal: Negative for myalgias, back pain and arthralgias.  Skin: Negative for rash.       No itching or dryness.  Neurological: Negative for tremors and numbness.       No tingling or balance problems.  Hematological: Negative for adenopathy. Does not bruise/bleed easily.  Psychiatric/Behavioral: Negative for dysphoric mood and agitation.       Objective:   Physical Exam  Constitutional: He is oriented to person, place, and time. He appears well-developed and well-nourished. No distress.  HENT:  Head: Normocephalic and atraumatic.  Right Ear: External ear normal.  Left Ear: External ear normal.  Nose: Nose normal.  Mouth/Throat: Oropharynx is clear and moist.  Eyes: Conjunctivae and EOM are normal. Pupils are equal, round, and reactive to light. Right eye exhibits no discharge. Left eye exhibits no discharge. No scleral icterus.  Neck: Normal range of motion. Neck supple. No thyromegaly present.    Cardiovascular: Normal rate, regular rhythm, normal heart sounds and intact distal pulses.   No murmur heard. Pulmonary/Chest: Effort normal and breath sounds normal. No respiratory distress. He has no wheezes.  Abdominal: Soft. Bowel sounds are normal. He exhibits no distension and no mass. There is no tenderness. There is no rebound and no guarding.       Guaiac Neg.  Genitourinary: Rectum normal, prostate normal and penis normal. Guaiac negative stool.       Prostate 60gms, smooth.  Musculoskeletal: Normal range of motion. He exhibits no edema.  Lymphadenopathy:    He has no cervical adenopathy.  Neurological: He is alert and oriented to person, place, and time. Coordination normal.  Skin: Skin is warm and dry. No rash noted. He is not diaphoretic.  Psychiatric: He has a normal mood and affect. His behavior is normal. Judgment and thought content normal.          Assessment & Plan:  HMPE Trial of BP observation, avoiding salt and exercising to lose weight.

## 2010-09-23 NOTE — Assessment & Plan Note (Signed)
None recently. Seems stable. Is active, no CP w/ activity.

## 2010-09-23 NOTE — Assessment & Plan Note (Addendum)
Stable...euthyroid on current dose. Lab Results  Component Value Date   TSH 2.72 09/20/2010

## 2010-09-23 NOTE — Assessment & Plan Note (Signed)
BP up today. Avoid salt, be active and lose weight....will recheck in 6 weeks.

## 2010-09-23 NOTE — Assessment & Plan Note (Signed)
Still mildly elevated. Microalbumin nml.  Discussed avoiding sweets and carbs.

## 2010-11-09 NOTE — Assessment & Plan Note (Signed)
[facility]                         GASTROENTEROLOGY OFFICE NOTE   NAME:Chad Avila, Chad Avila                      MRN:          [id number]  DATE:02/19/2007                            DOB:          [birth year]    REASON FOR CONSULTATION:  Lower abdominal discomfort and colonoscopy.   HISTORY:  This is a pleasant 55 year old white male with a history of  hypothyroidism who is referred through the courtesy of Dr. Hetty Ely  regarding lower abdominal discomfort and screening colonoscopy.  The  patient reports a 22-month history of fairly constant low pelvic or groin  discomfort.  This is very low and seems worse in the morning or worse  with bending or exertion.  He was evaluated by Dr. Hetty Ely who  diagnosed epididymitis.  The patient was treated with ciprofloxacin for  about 3 weeks and reports his symptoms to be 70% better.  He denies  constipation, diarrhea, change in bowel habits, melena, hematochezia, or  weight loss.  He has not had prior screening colonoscopy.   PAST MEDICAL HISTORY:  1. Hypothyroidism.  2. Gilbert's syndrome.   PAST SURGICAL HISTORY:  Umbilical hernia repair as an infant.   ALLERGIES:  No known drug allergies.   CURRENT MEDICATIONS:  Synthroid 0.15 mg daily.   FAMILY HISTORY:  Mother with colon polyps.   SOCIAL HISTORY:  The patient is married with 2 children. He lives with  his wife.  He has a 12th grade education.  He works as a Hospital doctor for [employer].  Does not smoke. Does use alcohol  occasionally.   REVIEW OF SYSTEMS:  Per diagnostic evaluation form.   PHYSICAL EXAMINATION:  GENERAL:  Well-appearing male in no acute  distress.  VITAL SIGNS:  Blood pressure is 122/82, heart rate is 64, weight is 157  pounds, he is 5 feet, 8 inches in height.  HEENT:  Sclerae are anicteric. Conjunctivae are pink. Oral mucosa is  intact.  NECK:  There is no adenopathy.  LUNGS:  Clear.  HEART:  Regular.  ABDOMEN:   Soft without tenderness, mass or hernia.  No evidence of  inguinal hernia.  RECTAL EXAM:  Negative per Dr. Hetty Ely. Not repeated.  EXTREMITIES:  Without edema.   IMPRESSION:  1. This is a 55 year old gentleman who presents with lower abdominal,      actually groin, discomfort of uncertain cause.  Possibly      epididymitis which has improved significantly with ciprofloxacin      and time.  No concern here for primary abdominal process.  2. Screening colonoscopy.  He is an appropriate candidate without      contraindication.  The nature of the procedure as well as the      risks, benefits and alternatives have been reviewed.  He understood      and agreed to proceed.   RECOMMENDATIONS:  1. Colonoscopy with polypectomy if necessary.  2. Ongoing general medical care with Dr. Hetty Ely.     Wilhemina Bonito. Marina Goodell, MD  Electronically Signed    JNP/MedQ  DD: 02/19/2007  DT: 02/19/2007  Job #: [id number]

## 2010-11-10 ENCOUNTER — Ambulatory Visit: Payer: PRIVATE HEALTH INSURANCE | Admitting: Family Medicine

## 2010-11-12 NOTE — Assessment & Plan Note (Signed)
[facility] OFFICE NOTE   NAME:Ellis, EFRAIN CLAUSON                      MRN:          [id number]  DATE:07/13/2006                            DOB:          02-11-1956    I was asked by Dr. Hetty Ely to evaluate Tiquan Bouch, a very pleasant,  55 year old, married, white male for chest discomfort.   HISTORY OF PRESENT ILLNESS:  Mr. Coba is a 55 year old, married,  white male with a chest aching over the left side of his chest which  sometimes radiates to his left shoulder and down his arm.  It does not  seem to be totally exertion related.  In fact it is there constantly  sometimes when he is at rest.  It is also made worse by repetitive  movements.   He denies any shortness of breath, dyspnea on exertion, but does have  some fatigue.   His cardiac risk factors are pertinent for age, male sex.  He has no  history of hypertension, diabetes, hyperlipidemia, and does not smoke.  He actually quit smoking in 1988.   He saw Dr. Learta Codding in 2001, at which time he had an exercise  treadmill for some chest discomfort.  He had some baseline nonspecific T  wave changes in the precordial leads.  He exercised a total of 10  minutes and 30 seconds with a peak heart rate of 158 beats per minute,  blood pressure 160/90.  This was felt to be an adequate test.  There was  normalization of T waves in the precordial leads.  It was felt this was  a negative test for ischemia.   PAST MEDICAL HISTORY:  1. He is currently on Synthroid 0.15 mg per day.  2. He has had previous hernia surgery in 1958.  3. HE HAS NO KNOWN DRUG ALLERGIES.  HE IS NOT ALLERGIC TO DYE.   He drinks a couple of beers per day.  He does not smoke.  He does not  use recreational drugs.  He does not exercise routinely but he enjoys  ice-skating and walking.   FAMILY HISTORY:  Negative for premature heart disease.   SOCIAL HISTORY:  He is an Equities trader, working on  International aid/development worker.  He recently changed jobs from [employer] to another  company.  This has required some change in his schedule, particularly  working a different shift, now on night shift.  He has been under  significant stress with this.   REVIEW OF SYSTEMS:  In addition to the HPI, he has a history of  allergies and hay fever.  He has a history of the hypothyroidism with  replacement therapy.  He has a history of reflux, but he has not had  much of this in quite some time.  He had some back pain that Dr.  Hetty Ely saw him for recently.  Dr. Hetty Ely put him on an antibiotic  for 10 days, thinking he might have had a little infection.   PHYSICAL EXAMINATION:  GENERAL:  He is a very pleasant gentleman in no  acute distress.  VITAL SIGNS:  He is 5 feet 8 inches, weighs 145 pounds.  His blood  pressure is 110/78.  His pulse is 68 and regular.  EKG was not repeated  from Dr. Lorenza Chick office.  HEENT:  Normocephalic atraumatic.  He is bearded.  PERRLA.  Extraocular  movements intact.  Sclerae are clear.  Facial symmetry is normal.  Dentition is satisfactory.  NECK:  Supple.  Carotid upstrokes are equal bilaterally without bruits.  There is no JVD.  Thyroid is not enlarged.  Trachea is midline.  LUNGS:  Clear to auscultation percussion.  There is no rub either  anteriorly or posteriorly.  HEART:  His PMI is not displaced.  He has normal S1 S2 without murmur,  rub, or gallop.  ABDOMEN:  Soft with good bowel sounds.  There is midline bruit.  There  is no tenderness.  There is no hepatomegalia.  EXTREMITIES:  Reveal no cyanosis, clubbing, or edema.  There is no sign  of DVT.  NEUROLOGIC:  Intact.  SKIN:  Intact.  MUSCULOSKELETAL:  Intact.   ASSESSMENT:  Chest discomfort with very few cardiac risk factors.   The patient is extremely worried that this could be a cardiac or  coronary issue.  I spent about 20 minutes talking to him about risk  factors  for coronary disease.  I have also talked to him about angina  and how it might present as well as an acute myocardial infarction and  how it might present down the road.  I have tried to reassure him that I  think his risk of having significant coronary disease at present is low.  He is anxious to continue exercise; therefore, we both felt that an  exercise rest stress test Myoview would give him the confidence and  security that he needs.  We will arrange for this in the next week or  so.     Thomas C. Daleen Squibb, MD, [facility]  Electronically Signed    TCW/MedQ  DD: 07/13/2006  DT: 07/13/2006  Job #: [id number]   cc:   Arta Silence, MD

## 2010-12-16 ENCOUNTER — Encounter: Payer: Self-pay | Admitting: Cardiovascular Disease

## 2011-01-14 ENCOUNTER — Ambulatory Visit (INDEPENDENT_AMBULATORY_CARE_PROVIDER_SITE_OTHER): Payer: PRIVATE HEALTH INSURANCE | Admitting: Family Medicine

## 2011-01-14 ENCOUNTER — Encounter: Payer: Self-pay | Admitting: Family Medicine

## 2011-01-14 DIAGNOSIS — IMO0001 Reserved for inherently not codable concepts without codable children: Secondary | ICD-10-CM

## 2011-01-14 DIAGNOSIS — K4020 Bilateral inguinal hernia, without obstruction or gangrene, not specified as recurrent: Secondary | ICD-10-CM | POA: Insufficient documentation

## 2011-01-14 DIAGNOSIS — H6590 Unspecified nonsuppurative otitis media, unspecified ear: Secondary | ICD-10-CM | POA: Insufficient documentation

## 2011-01-14 DIAGNOSIS — R079 Chest pain, unspecified: Secondary | ICD-10-CM

## 2011-01-14 DIAGNOSIS — R030 Elevated blood-pressure reading, without diagnosis of hypertension: Secondary | ICD-10-CM

## 2011-01-14 NOTE — Assessment & Plan Note (Signed)
Refer to general surgery.  Okay for outpatient f/u.  Anatomy d/w pt.

## 2011-01-14 NOTE — Assessment & Plan Note (Addendum)
Likely chest wall pain.  Minimal sx for patient.  He'll monitor and notify me if sx increase/don't resolve.  The exam was reassuring and he has no sx that are typical cardiac symptoms.  I expect this to resolve gradually.  We talked about imaging today, but this was deferred as it would likely not change sx.  He agrees.

## 2011-01-14 NOTE — Patient Instructions (Signed)
See Shirlee Limerick about your referral before your leave today. I would try claritin/loratadine 10mg  a day for the stuffiness.  Let me know if it gets worse. Let me know if the chest wall pain continues or increases.  I think it will gradually improve.  Take care.

## 2011-01-14 NOTE — Assessment & Plan Note (Signed)
I would treat symptomatically with claritin and see if this didn't improve.  I told pt that I couldn't say it was related to the flu shot.  I suspect this is more a chronic allergy/irritant issue.

## 2011-01-14 NOTE — Progress Notes (Signed)
Was prev told (1992) that he likely had a L inguinal hernia.  He has noted a lump in the area at the end of the day, worse with cough.  No sx supine or in AM.  Noticeable tenderness most of the day.    "Siinuses are tuffy" since his last flu shot fall 2011.  Unclear about the relationship of shot/sx.  No meds tried.  No fevers.    Chest pain with bending over, across the upper chest.  Sometimes it radiates across the chest, sometime through.  Off and on for months.  Happens almost every time when bending over or when rolling over.  No exertional sx.  Occ noted with lifting heavy objects (he's been avoiding this due to the hernia).  Minor but similar CP with cough.  No h/o CAD.  Prev with neg stress test ~5 years ago.    Meds, vitals, and allergies reviewed.   ROS: See HPI.  Otherwise, noncontributory.  GEN: nad, alert and oriented HEENT: mucous membranes moist, B SOM noted with minimal clear rhinorrhea.  OP w/o erythema NECK: supple w/o LA CV: rrr. Chest wall ttp in L upper anterior region- this reproduced the pain. Worse with deep breath, better with application of pressure to chest wall opposing a deep breath.  PULM: ctab, no inc wob ABD: soft, +bs, L>R inguinal hernia noted, easily reduceable  EXT: no edema SKIN: no acute rash

## 2011-01-14 NOTE — Assessment & Plan Note (Signed)
BP okay today

## 2011-03-22 ENCOUNTER — Ambulatory Visit: Payer: Self-pay | Admitting: General Surgery

## 2011-03-22 HISTORY — PX: OTHER SURGICAL HISTORY: SHX169

## 2011-03-29 ENCOUNTER — Encounter: Payer: Self-pay | Admitting: Family Medicine

## 2011-03-31 ENCOUNTER — Encounter: Payer: Self-pay | Admitting: General Surgery

## 2011-04-04 ENCOUNTER — Other Ambulatory Visit: Payer: Self-pay | Admitting: *Deleted

## 2011-04-04 MED ORDER — LEVOTHYROXINE SODIUM 175 MCG PO TABS: 175.0000 ug | ORAL_TABLET | Freq: Every day | ORAL | Status: DC

## 2011-05-13 DIAGNOSIS — Z0389 Encounter for observation for other suspected diseases and conditions ruled out: Secondary | ICD-10-CM | POA: Insufficient documentation

## 2011-07-01 ENCOUNTER — Encounter: Payer: Self-pay | Admitting: Family Medicine

## 2011-07-01 ENCOUNTER — Ambulatory Visit (INDEPENDENT_AMBULATORY_CARE_PROVIDER_SITE_OTHER): Payer: PRIVATE HEALTH INSURANCE | Admitting: Family Medicine

## 2011-07-01 VITALS — BP 150/98 | HR 67 | Temp 98.6°F | Resp 16 | Ht 68.0 in | Wt 170.0 lb

## 2011-07-01 DIAGNOSIS — J3489 Other specified disorders of nose and nasal sinuses: Secondary | ICD-10-CM

## 2011-07-01 DIAGNOSIS — J329 Chronic sinusitis, unspecified: Secondary | ICD-10-CM

## 2011-07-01 MED ORDER — AMOXICILLIN-POT CLAVULANATE 875-125 MG PO TABS: 1.0000 | ORAL_TABLET | Freq: Two times a day (BID) | ORAL | Status: AC

## 2011-07-01 MED ORDER — GUAIFENESIN-CODEINE 100-10 MG/5ML PO SYRP: 5.0000 mL | ORAL_SOLUTION | Freq: Two times a day (BID) | ORAL | Status: AC | PRN

## 2011-07-01 NOTE — Progress Notes (Signed)
  Subjective:    Patient ID: Chad Avila, male    DOB: Oct 20, 1955, 56 y.o.   MRN: [id number]  HPI CC: sinusitis  8d h/o stuffiness, sinus pressure HA, drainage, coughing productive of dark mucous.  Clear with blowing nose.  + ST.  So far has tried cold med for sleeping, but staying congested in am.  No fevers/chills, ear pain or tooth pain, abd pain, n/v, dizziness, chest pain, sob.  No sick contacts or smokers at home.  No h/o asthma, COPD.  No h/o HTN, but bp elevated today - attributed to feeling ill today.  H/o sinus infections in past.  Review of Systems Per HPI    Objective:   Physical Exam  Nursing note and vitals reviewed. Constitutional: He appears well-developed and well-nourished. No distress.       Evidently congested  HENT:  Head: Normocephalic and atraumatic.  Right Ear: Hearing, tympanic membrane, external ear and ear canal normal.  Left Ear: Hearing, tympanic membrane, external ear and ear canal normal.  Nose: Nose normal. No mucosal edema or rhinorrhea. Right sinus exhibits no maxillary sinus tenderness and no frontal sinus tenderness. Left sinus exhibits no maxillary sinus tenderness and no frontal sinus tenderness.  Mouth/Throat: Oropharynx is clear and moist and mucous membranes are normal. No oropharyngeal exudate, posterior oropharyngeal edema, posterior oropharyngeal erythema or tonsillar abscesses.       Asymmetric uvula elevation  Eyes: Conjunctivae and EOM are normal. Pupils are equal, round, and reactive to light. No scleral icterus.  Neck: Normal range of motion. Neck supple.  Cardiovascular: Normal rate, regular rhythm, normal heart sounds and intact distal pulses.   No murmur heard. Pulmonary/Chest: Effort normal and breath sounds normal. No respiratory distress. He has no wheezes. He has no rales.  Lymphadenopathy:    He has no cervical adenopathy.  Skin: Skin is warm and dry. No rash noted.       Assessment & Plan:

## 2011-07-01 NOTE — Patient Instructions (Signed)
Keep an eye on blood pressure when you're feeling better, if consistently >140/90, please let us know. You have a sinus infection. Take medicine as prescribed: augmentin to hold on to in case not improving as expected. Push fluids and plenty of rest. Cough syrup - cheratussin for cough at night. Nasal saline irrigation or neti pot to help drain sinuses. May use simple mucinex with plenty of fluid to help mobilize mucous. Let us know if fever >101.5, trouble opening/closing mouth, difficulty swallowing, or worsening - you may need to be seen again.

## 2011-07-01 NOTE — Assessment & Plan Note (Signed)
Consistent with sinusitis, 8dcourse. Provide with WASP script for augmentin in case not improving with time, fill abx. cheratussin for cough at night, mucinex during day.

## 2011-08-24 ENCOUNTER — Telehealth: Payer: Self-pay | Admitting: Family Medicine

## 2011-08-24 NOTE — Telephone Encounter (Signed)
See if he can be added on tomorrow.  Thanks.  

## 2011-08-24 NOTE — Telephone Encounter (Signed)
Spoke to patient and appointment was changed to Thursday 08/25/11 per Dr. Para March.

## 2011-08-24 NOTE — Telephone Encounter (Signed)
Triage Record Num: [id number] Operator: Di Kindle Patient Name: Chad Avila Call Date & Time: 08/24/2011 3:27:59PM Patient Phone: [phone] PCP: Crawford Givens Patient Gender: Male PCP Fax : Patient DOB: [birth year] Practice Name: [facility] Reason for Call: OFFICE Please Caller: [name]/Patient; PCP: Crawford Givens Clelia Croft); CB#: [phone]; Call regarding Cough/Congestion; follows completion of ABX, states BP has continued to be high. BP with home monitor has been 176/98, with headaches, has appt scheduled for 08/26/11 (first avaiable in office) Sinus symptoms continued, using OTC allergy med, afebrile. Symptoms reviewed Headache Guideline, no meds used. Disp: see within 24 hrs with continuing headache and BP concerns, uses [facility]. Please call with recommndations. OFFICE PLEASE Protocol(s) Used: Headache Recommended Outcome per Protocol: See Provider within 24 hours Reason for Outcome: Constant headache AND unrelieved with nonprescription medications Care Advice: ~ Call provider immediately if headache becomes more severe. Most adults need to drink 6-10 eight-ounce glasses (1.2-2.0 liters) of fluids per day unless previously told to limit fluid intake for other medical reasons. Limit fluids that contain caffeine, sugar or alcohol. Urine will be a very light yellow color when you drink enough fluids. ~ 08/24/2011 3:36:14PM Page 1 of 1 [email or web address]

## 2011-08-25 ENCOUNTER — Ambulatory Visit (INDEPENDENT_AMBULATORY_CARE_PROVIDER_SITE_OTHER): Payer: PRIVATE HEALTH INSURANCE | Admitting: Family Medicine

## 2011-08-25 ENCOUNTER — Encounter: Payer: Self-pay | Admitting: Family Medicine

## 2011-08-25 DIAGNOSIS — R351 Nocturia: Secondary | ICD-10-CM

## 2011-08-25 DIAGNOSIS — H6590 Unspecified nonsuppurative otitis media, unspecified ear: Secondary | ICD-10-CM

## 2011-08-25 DIAGNOSIS — R030 Elevated blood-pressure reading, without diagnosis of hypertension: Secondary | ICD-10-CM

## 2011-08-25 DIAGNOSIS — IMO0001 Reserved for inherently not codable concepts without codable children: Secondary | ICD-10-CM

## 2011-08-25 MED ORDER — FLUTICASONE PROPIONATE 50 MCG/ACT NA SUSP: 2.0000 | Freq: Every day | NASAL | Status: DC

## 2011-08-25 NOTE — Progress Notes (Signed)
He was on flomax before the hernia repair. Stream is good. Off med now, nocturia x1 now, was resolved when on medicine. For him the nocturia wasn't bothersome enough to continue the medicine.  He would like to stay off flomax and I think this is reasonable.   BP elevation on home checks.  He didn't have trouble with BP checks in hospital.  I don't know if his cuff is accurate.  He does have some HA when the BP readings are high. No focal neuro changes.  He needs to calibrate his cuff.    Runny and stuffy nose, intermittently since 1/13. No change on augmentin prev. No fevers.  Some cough, esp in AM, with some AM sputum. No ear pain but stuffy.  He'll get better and then his sx get worse, never fully resolved.   Meds, vitals, and allergies reviewed.   ROS: See HPI.  Otherwise, noncontributory.  GEN: nad, alert and oriented HEENT: mucous membranes moist, tm w/o erythema, nasal exam w/o erythema, clear discharge noted,  OP with cobblestoning, SOM B with sluggish TM movement NECK: supple w/o LA CV: rrr.   PULM: ctab, no inc wob EXT: no edema SKIN: no acute rash

## 2011-08-25 NOTE — Patient Instructions (Signed)
Use nasal saline each morning and then the flonase.   Calibrate your cuff and let me know about the numbers.

## 2011-08-26 ENCOUNTER — Ambulatory Visit: Payer: PRIVATE HEALTH INSURANCE | Admitting: Family Medicine

## 2011-08-26 DIAGNOSIS — R351 Nocturia: Secondary | ICD-10-CM | POA: Insufficient documentation

## 2011-08-26 NOTE — Assessment & Plan Note (Signed)
With ETD. Use flonase and nasal saline, anatomy dw pt.  Understood. F/u prn.

## 2011-08-26 NOTE — Assessment & Plan Note (Signed)
For him the nocturia wasn't bothersome enough to continue the medicine.  He would like to stay off flomax and I think this is reasonable.

## 2011-08-26 NOTE — Assessment & Plan Note (Signed)
He'll calibrate cuff and f/u here. BP okay today.

## 2011-09-22 ENCOUNTER — Other Ambulatory Visit: Payer: Self-pay | Admitting: Family Medicine

## 2011-09-22 DIAGNOSIS — IMO0001 Reserved for inherently not codable concepts without codable children: Secondary | ICD-10-CM

## 2011-09-22 DIAGNOSIS — E039 Hypothyroidism, unspecified: Secondary | ICD-10-CM

## 2011-09-26 ENCOUNTER — Other Ambulatory Visit (INDEPENDENT_AMBULATORY_CARE_PROVIDER_SITE_OTHER): Payer: PRIVATE HEALTH INSURANCE

## 2011-09-26 DIAGNOSIS — R030 Elevated blood-pressure reading, without diagnosis of hypertension: Secondary | ICD-10-CM

## 2011-09-26 DIAGNOSIS — E039 Hypothyroidism, unspecified: Secondary | ICD-10-CM

## 2011-09-26 DIAGNOSIS — IMO0001 Reserved for inherently not codable concepts without codable children: Secondary | ICD-10-CM

## 2011-09-26 LAB — COMPREHENSIVE METABOLIC PANEL
Albumin: 4.6 g/dL (ref 3.5–5.2)
Alkaline Phosphatase: 44 U/L (ref 39–117)
CO2: 28 mEq/L (ref 19–32)
Calcium: 8.9 mg/dL (ref 8.4–10.5)
Chloride: 106 mEq/L (ref 96–112)
GFR: 86.26 mL/min (ref >60.00)
Glucose, Bld: 109 mg/dL — ABNORMAL HIGH (ref 70–99)
Potassium: 4.3 mEq/L (ref 3.5–5.1)
Sodium: 144 mEq/L (ref 135–145)
Total Protein: 7.4 g/dL (ref 6.0–8.3)

## 2011-09-26 LAB — LIPID PANEL
Cholesterol: 157 mg/dL (ref 0–200)
LDL Cholesterol: 98 mg/dL (ref 0–99)

## 2011-09-30 ENCOUNTER — Ambulatory Visit (INDEPENDENT_AMBULATORY_CARE_PROVIDER_SITE_OTHER): Payer: PRIVATE HEALTH INSURANCE | Admitting: Family Medicine

## 2011-09-30 ENCOUNTER — Encounter: Payer: Self-pay | Admitting: Family Medicine

## 2011-09-30 VITALS — BP 132/82 | HR 66 | Temp 98.3°F | Wt 164.0 lb

## 2011-09-30 DIAGNOSIS — R7309 Other abnormal glucose: Secondary | ICD-10-CM

## 2011-09-30 DIAGNOSIS — N529 Male erectile dysfunction, unspecified: Secondary | ICD-10-CM

## 2011-09-30 DIAGNOSIS — Z0000 Encounter for general adult medical examination without abnormal findings: Secondary | ICD-10-CM

## 2011-09-30 DIAGNOSIS — E039 Hypothyroidism, unspecified: Secondary | ICD-10-CM

## 2011-09-30 DIAGNOSIS — F40298 Other specified phobia: Secondary | ICD-10-CM

## 2011-09-30 DIAGNOSIS — H6590 Unspecified nonsuppurative otitis media, unspecified ear: Secondary | ICD-10-CM

## 2011-09-30 MED ORDER — ALPRAZOLAM ER 0.5 MG PO TB24: ORAL_TABLET | ORAL | Status: DC

## 2011-09-30 MED ORDER — TADALAFIL 5 MG PO TABS: 5.0000 mg | ORAL_TABLET | Freq: Every day | ORAL | Status: DC | PRN

## 2011-09-30 NOTE — Progress Notes (Signed)
CPE- See plan.  Routine anticipatory guidance given to patient.  See health maintenance. Colonoscopy 2008, 10 year f/u.  PSA options were discussed along with recent recs.  No indication for psa at this point, since patient is low risk and there is no FH of prosate CA.  He declined testing of PSA. DRE prev unremarkable.  Td 2004 Flu shot discussed, prev had had these, encouraged for fall 2013.   Mild inc in sugar.  We talked about this.  He'll work on cutting out carbs.   Hypothyroid, mildly elevated TSH.  No neck mass, no dysphagia.  Compliant with meds.  We talked about options.    SOM better with flonase.  No FCNAV.  Not stuffy now.  Feeling better.    Claustrophobia with flying.  Takes xanax before plane rides, needs new rx.  No ADE with the med.   ED, occ use of cialis.  No ADE, it works well.    PMH and SH reviewed  Meds, vitals, and allergies reviewed.   ROS: See HPI.  Otherwise negative.    GEN: nad, alert and oriented HEENT: mucous membranes moist NECK: supple w/o LA CV: rrr. PULM: ctab, no inc wob ABD: soft, +bs EXT: no edema SKIN: no acute rash

## 2011-09-30 NOTE — Patient Instructions (Addendum)
I would get a flu shot each fall.   Don't change your meds for now. Recheck TSH in 2 months at a lab visit.   Take care.  Glad to see you.

## 2011-10-02 ENCOUNTER — Encounter: Payer: Self-pay | Admitting: Family Medicine

## 2011-10-02 DIAGNOSIS — Z0000 Encounter for general adult medical examination without abnormal findings: Secondary | ICD-10-CM | POA: Insufficient documentation

## 2011-10-02 NOTE — Assessment & Plan Note (Signed)
Continue prn xanax for plane flights.  That's basically the only time he uses the medicine.

## 2011-10-02 NOTE — Assessment & Plan Note (Signed)
Mild, he'll work on diet.

## 2011-10-02 NOTE — Assessment & Plan Note (Signed)
Treated, no ADE, continue as is.

## 2011-10-02 NOTE — Assessment & Plan Note (Signed)
He's had variation before.  No sig sx.  Will recheck in 2 months and adjust if needed.  He agrees. No bruit or TMG on exam.

## 2011-10-02 NOTE — Assessment & Plan Note (Addendum)
Routine anticipatory guidance given to patient.  See health maintenance. Colonoscopy 2008, 10 year f/u.  PSA options were discussed along with recent recs.  No indication for psa at this point, since patient is low risk and there is no FH of prosate CA.  He declined testing of PSA. DRE prev unremarkable.  Td 2004 Flu shot discussed, prev had had these, encouraged for fall 2013.  Per patient, his son Yared is his HCPOA.

## 2011-10-02 NOTE — Assessment & Plan Note (Signed)
Resolved with flonase, use prn.

## 2012-04-25 ENCOUNTER — Encounter: Payer: Self-pay | Admitting: Family Medicine

## 2012-04-25 ENCOUNTER — Ambulatory Visit (INDEPENDENT_AMBULATORY_CARE_PROVIDER_SITE_OTHER): Payer: PRIVATE HEALTH INSURANCE | Admitting: Family Medicine

## 2012-04-25 VITALS — BP 122/80 | HR 67 | Temp 97.9°F | Wt 165.0 lb

## 2012-04-25 DIAGNOSIS — L723 Sebaceous cyst: Secondary | ICD-10-CM

## 2012-04-25 NOTE — Patient Instructions (Addendum)
Pull the packing Friday morning.  You can shower as normal at that point.  Wash with soap and water.  Keep area covered.  If you have pus draining, more redness, more pain, or fever, then notify the clinic. Take care.

## 2012-04-25 NOTE — Progress Notes (Signed)
Bump on L side of chest, present for years, enlarged, more tender, redder in last 6 weeks.  Feels well o/w.  No FCNAV.    I&D  Meds, vitals, and allergies reviewed.   Indication: irritated seb cyst  Pt complaints of: erythema, pain, swelling  Location: L chest wall  Size:3 cm   informed consent obtained.  Pt aware of risks not limited to but including infection, bleeding, damage to near by organs.  Prep: etoh/betadine  Anesthesia: 2%lidocaine with epi, good effect  Incision made with #11 blade  Would explored and loculations removed  Wound packed with iodoform gauze  Tolerated well, no complications.

## 2012-04-25 NOTE — Assessment & Plan Note (Signed)
I&D, packed.  No complications.  Routine postprocedure instructions d/w pt- remove packing Friday AM, keep area clean and bandaged, follow up if concerns/spreading erythema/pain.

## 2012-06-01 ENCOUNTER — Other Ambulatory Visit: Payer: Self-pay | Admitting: *Deleted

## 2012-06-01 MED ORDER — LEVOTHYROXINE SODIUM 175 MCG PO TABS: 175.0000 ug | ORAL_TABLET | Freq: Every day | ORAL | Status: DC

## 2012-08-11 ENCOUNTER — Other Ambulatory Visit: Payer: Self-pay

## 2012-09-27 ENCOUNTER — Other Ambulatory Visit: Payer: Self-pay | Admitting: Family Medicine

## 2012-09-27 ENCOUNTER — Other Ambulatory Visit (INDEPENDENT_AMBULATORY_CARE_PROVIDER_SITE_OTHER): Payer: PRIVATE HEALTH INSURANCE

## 2012-09-27 DIAGNOSIS — E039 Hypothyroidism, unspecified: Secondary | ICD-10-CM

## 2012-09-27 DIAGNOSIS — R739 Hyperglycemia, unspecified: Secondary | ICD-10-CM

## 2012-09-27 DIAGNOSIS — R7309 Other abnormal glucose: Secondary | ICD-10-CM

## 2012-09-27 LAB — LIPID PANEL
Cholesterol: 179 mg/dL (ref 0–200)
HDL: 53.1 mg/dL (ref >39.00)
LDL Cholesterol: 115 mg/dL — ABNORMAL HIGH (ref 0–99)
VLDL: 11.2 mg/dL (ref 0.0–40.0)

## 2012-09-27 LAB — COMPREHENSIVE METABOLIC PANEL
AST: 20 U/L (ref 0–37)
Alkaline Phosphatase: 43 U/L (ref 39–117)
BUN: 13 mg/dL (ref 6–23)
Creatinine, Ser: 0.9 mg/dL (ref 0.4–1.5)
Glucose, Bld: 111 mg/dL — ABNORMAL HIGH (ref 70–99)
Total Bilirubin: 2 mg/dL — ABNORMAL HIGH (ref 0.3–1.2)

## 2012-09-27 LAB — TSH: TSH: 6.95 u[IU]/mL — ABNORMAL HIGH (ref 0.35–5.50)

## 2012-10-02 ENCOUNTER — Encounter: Payer: Self-pay | Admitting: Family Medicine

## 2012-10-02 ENCOUNTER — Ambulatory Visit (INDEPENDENT_AMBULATORY_CARE_PROVIDER_SITE_OTHER): Payer: PRIVATE HEALTH INSURANCE | Admitting: Family Medicine

## 2012-10-02 VITALS — BP 142/90 | HR 75 | Temp 98.5°F | Ht 68.0 in | Wt 162.5 lb

## 2012-10-02 DIAGNOSIS — Z0000 Encounter for general adult medical examination without abnormal findings: Secondary | ICD-10-CM

## 2012-10-02 DIAGNOSIS — E039 Hypothyroidism, unspecified: Secondary | ICD-10-CM

## 2012-10-02 DIAGNOSIS — Z23 Encounter for immunization: Secondary | ICD-10-CM

## 2012-10-02 MED ORDER — FLUTICASONE PROPIONATE 50 MCG/ACT NA SUSP: 2.0000 | Freq: Every day | NASAL | Status: DC | PRN

## 2012-10-02 MED ORDER — LEVOTHYROXINE SODIUM 175 MCG PO TABS: 175.0000 ug | ORAL_TABLET | Freq: Every day | ORAL | Status: DC

## 2012-10-02 NOTE — Patient Instructions (Signed)
Increase the thyroid medicine to 1.5 tabs on Sunday, 1 a day on other days.   Recheck TSH in 2 months at a lab visit.   If the fatigue doesn't improve, then notify us.  Take care.  Glad to see you.

## 2012-10-02 NOTE — Progress Notes (Signed)
CPE- See plan.  Routine anticipatory guidance given to patient.  See health maintenance. Tetanus 2014 Flu shot encouraged Colonoscopy 2008 Prostate cancer screening and PSA options (with potential risks and benefits of testing vs not testing) were discussed along with recent recs/guidelines.  He declined testing PSA at this point. Diet and exercise d/w pt. He's working on both.   Living will d/w pt.  His son is designated if he is incapacitated.  Mild inc in sugar d/w pt.    Hypothyroid, mildly elevated TSH. No neck mass, no dysphagia. Compliant with meds. We talked about options. He has some fatigue and this is similar to when he was starting to be hypothyroid.    Allergy sx improved with flonase. No FCNAV. Not stuffy now. Feeling better.   Claustrophobia with flying. Takes xanax before plane rides, needs new rx. No ADE with the med.   PMH and SH reviewed  Meds, vitals, and allergies reviewed.   ROS: See HPI.  Otherwise negative.    GEN: nad, alert and oriented HEENT: mucous membranes moist NECK: supple w/o LA, no TMG CV: rrr. PULM: ctab, no inc wob ABD: soft, +bs EXT: no edema SKIN: no acute rash

## 2012-10-03 NOTE — Assessment & Plan Note (Signed)
Increase the thyroid replacement to 1.5 tabs on Sunday, 1 a day on other days.  Recheck TSH in 2 months at a lab visit.  If the fatigue doesn't improve, then he'll notify us.  Labs d/w pt.

## 2012-10-03 NOTE — Assessment & Plan Note (Signed)
Routine anticipatory guidance given to patient.  See health maintenance. Tetanus 2014 Flu shot encouraged Colonoscopy 2008 Prostate cancer screening and PSA options (with potential risks and benefits of testing vs not testing) were discussed along with recent recs/guidelines.  He declined testing PSA at this point. Diet and exercise d/w pt. He's working on both.   Living will d/w pt.  His son is designated if he is incapacitated.  Mild inc in sugar d/w pt.

## 2012-11-26 ENCOUNTER — Encounter: Payer: Self-pay | Admitting: Radiology

## 2012-11-27 ENCOUNTER — Other Ambulatory Visit (INDEPENDENT_AMBULATORY_CARE_PROVIDER_SITE_OTHER): Payer: Managed Care, Other (non HMO)

## 2012-11-27 DIAGNOSIS — E039 Hypothyroidism, unspecified: Secondary | ICD-10-CM

## 2012-12-14 ENCOUNTER — Encounter: Payer: Self-pay | Admitting: Family Medicine

## 2013-05-02 ENCOUNTER — Other Ambulatory Visit: Payer: Self-pay

## 2013-05-14 ENCOUNTER — Other Ambulatory Visit: Payer: Self-pay | Admitting: Family Medicine

## 2013-05-14 NOTE — Telephone Encounter (Signed)
Electronic refill request.  Please advise. 

## 2013-05-15 ENCOUNTER — Encounter: Payer: Self-pay | Admitting: Family Medicine

## 2013-05-15 NOTE — Telephone Encounter (Signed)
Sent!

## 2013-06-18 ENCOUNTER — Encounter: Payer: Self-pay | Admitting: *Deleted

## 2013-10-22 ENCOUNTER — Other Ambulatory Visit: Payer: Self-pay | Admitting: Family Medicine

## 2013-12-10 ENCOUNTER — Encounter: Payer: Self-pay | Admitting: Family Medicine

## 2013-12-11 ENCOUNTER — Other Ambulatory Visit: Payer: Self-pay

## 2013-12-17 ENCOUNTER — Other Ambulatory Visit: Payer: Self-pay | Admitting: Family Medicine

## 2013-12-17 MED ORDER — TADALAFIL 5 MG PO TABS: 5.0000 mg | ORAL_TABLET | Freq: Every day | ORAL | Status: DC | PRN

## 2013-12-17 MED ORDER — LEVOTHYROXINE SODIUM 175 MCG PO TABS: ORAL_TABLET | ORAL | Status: DC

## 2013-12-20 ENCOUNTER — Other Ambulatory Visit: Payer: Self-pay | Admitting: *Deleted

## 2013-12-20 MED ORDER — LEVOTHYROXINE SODIUM 175 MCG PO TABS: ORAL_TABLET | ORAL | Status: DC

## 2013-12-28 ENCOUNTER — Other Ambulatory Visit: Payer: Self-pay | Admitting: Family Medicine

## 2013-12-28 DIAGNOSIS — R7309 Other abnormal glucose: Secondary | ICD-10-CM

## 2013-12-28 DIAGNOSIS — E039 Hypothyroidism, unspecified: Secondary | ICD-10-CM

## 2013-12-31 ENCOUNTER — Other Ambulatory Visit (INDEPENDENT_AMBULATORY_CARE_PROVIDER_SITE_OTHER): Payer: Managed Care, Other (non HMO)

## 2013-12-31 DIAGNOSIS — R030 Elevated blood-pressure reading, without diagnosis of hypertension: Secondary | ICD-10-CM

## 2013-12-31 DIAGNOSIS — E039 Hypothyroidism, unspecified: Secondary | ICD-10-CM

## 2013-12-31 DIAGNOSIS — R7309 Other abnormal glucose: Secondary | ICD-10-CM

## 2013-12-31 DIAGNOSIS — IMO0001 Reserved for inherently not codable concepts without codable children: Secondary | ICD-10-CM

## 2013-12-31 LAB — COMPREHENSIVE METABOLIC PANEL
ALT: 23 U/L (ref 0–53)
AST: 21 U/L (ref 0–37)
Albumin: 4.3 g/dL (ref 3.5–5.2)
Alkaline Phosphatase: 40 U/L (ref 39–117)
BUN: 12 mg/dL (ref 6–23)
CO2: 31 meq/L (ref 19–32)
Calcium: 9.1 mg/dL (ref 8.4–10.5)
Chloride: 105 mEq/L (ref 96–112)
Creatinine, Ser: 1.1 mg/dL (ref 0.4–1.5)
GFR: 77.16 mL/min (ref >60.00)
Glucose, Bld: 119 mg/dL — ABNORMAL HIGH (ref 70–99)
POTASSIUM: 3.9 meq/L (ref 3.5–5.1)
SODIUM: 141 meq/L (ref 135–145)
TOTAL PROTEIN: 7.2 g/dL (ref 6.0–8.3)
Total Bilirubin: 2.3 mg/dL — ABNORMAL HIGH (ref 0.2–1.2)

## 2013-12-31 LAB — LIPID PANEL
CHOLESTEROL: 202 mg/dL — AB (ref 0–200)
HDL: 52.5 mg/dL (ref >39.00)
LDL Cholesterol: 132 mg/dL — ABNORMAL HIGH (ref 0–99)
NonHDL: 149.5
Total CHOL/HDL Ratio: 4
Triglycerides: 87 mg/dL (ref 0.0–149.0)
VLDL: 17.4 mg/dL (ref 0.0–40.0)

## 2013-12-31 LAB — TSH: TSH: 2.56 u[IU]/mL (ref 0.35–4.50)

## 2014-01-02 ENCOUNTER — Encounter: Payer: Self-pay | Admitting: Family Medicine

## 2014-01-02 ENCOUNTER — Ambulatory Visit (INDEPENDENT_AMBULATORY_CARE_PROVIDER_SITE_OTHER): Payer: Managed Care, Other (non HMO) | Admitting: Family Medicine

## 2014-01-02 VITALS — BP 134/82 | HR 62 | Temp 98.0°F | Ht 68.0 in | Wt 161.2 lb

## 2014-01-02 DIAGNOSIS — R7309 Other abnormal glucose: Secondary | ICD-10-CM

## 2014-01-02 DIAGNOSIS — Z0000 Encounter for general adult medical examination without abnormal findings: Secondary | ICD-10-CM

## 2014-01-02 DIAGNOSIS — N529 Male erectile dysfunction, unspecified: Secondary | ICD-10-CM

## 2014-01-02 DIAGNOSIS — E039 Hypothyroidism, unspecified: Secondary | ICD-10-CM

## 2014-01-02 MED ORDER — LEVOTHYROXINE SODIUM 175 MCG PO TABS: ORAL_TABLET | ORAL | Status: DC

## 2014-01-02 MED ORDER — TADALAFIL 5 MG PO TABS: 5.0000 mg | ORAL_TABLET | Freq: Every day | ORAL | Status: DC | PRN

## 2014-01-02 NOTE — Assessment & Plan Note (Signed)
D/w pt about diet and exercise, along with labs.  Will monitor.

## 2014-01-02 NOTE — Assessment & Plan Note (Signed)
tsh wnl, continue as is.  He agrees. No TMG on exam.

## 2014-01-02 NOTE — Assessment & Plan Note (Signed)
Routine anticipatory guidance given to patient.  See health maintenance. Tetanus 2014 PNA and shingles shot not due.  D/w pt.   Flu shot can be done later in the fall.  Colonoscopy 2008 Prostate cancer screening and PSA options (with potential risks and benefits of testing vs not testing) were discussed along with recent recs/guidelines.  He declined testing PSA at this point. Living will d/w pt.  Son designated if patient were incapacitated.   Diet and exercise d/w pt.  Encouraged both.  Less exercise recently.

## 2014-01-02 NOTE — Assessment & Plan Note (Signed)
Needed refill on cialis. Has worked prev w/o ADE.  Refill printed.

## 2014-01-02 NOTE — Patient Instructions (Signed)
Keep exercising, try to avoid sweets and carbs (white bread, potatoes, rice, pasta).  Take care.  Glad to see you.

## 2014-01-02 NOTE — Progress Notes (Signed)
Pre visit review using our clinic review tool, if applicable. No additional management support is needed unless otherwise documented below in the visit note.  CPE- See plan.  Routine anticipatory guidance given to patient.  See health maintenance. Tetanus 2014 PNA and shingles shot not due.  D/w pt.   Flu shot can be done later in the fall.  Colonoscopy 2008 Prostate cancer screening and PSA options (with potential risks and benefits of testing vs not testing) were discussed along with recent recs/guidelines.  He declined testing PSA at this point. Living will d/w pt.  Son designated if patient were incapacitated.   Diet and exercise d/w pt.  Encouraged both.  Less exercise recently.    ED.  Needed refill on cialis.  Has worked prev w/o ADE.    Hypothyroid.  No neck mass, no fatigue, no dysphagia.   Hyperglycemia. D/w pt re: diet and exercise. Labs d/w pt.   PMH and SH reviewed  Meds, vitals, and allergies reviewed.   ROS: See HPI.  Otherwise negative.    GEN: nad, alert and oriented HEENT: mucous membranes moist NECK: supple w/o LA CV: rrr. PULM: ctab, no inc wob ABD: soft, +bs EXT: no edema SKIN: no acute rash

## 2014-02-10 ENCOUNTER — Encounter: Payer: Self-pay | Admitting: Family Medicine

## 2014-02-10 ENCOUNTER — Ambulatory Visit (INDEPENDENT_AMBULATORY_CARE_PROVIDER_SITE_OTHER): Payer: Managed Care, Other (non HMO) | Admitting: Family Medicine

## 2014-02-10 VITALS — BP 140/78 | HR 60 | Temp 98.2°F | Wt 161.2 lb

## 2014-02-10 DIAGNOSIS — M542 Cervicalgia: Secondary | ICD-10-CM

## 2014-02-10 NOTE — Patient Instructions (Signed)
You likely pulled your left trap and SCM.   Ibuprofen 400-600mg  3 times a day with food if needed.  You may only need it at night.  Heat and stretching, follow up as needed.

## 2014-02-10 NOTE — Assessment & Plan Note (Signed)
Likely benign msk source, trap and scm.  See instructions.  D/w re: stretching, ibuprofen with GI caution.  Should resolve, f/u prn. He agrees.

## 2014-02-10 NOTE — Progress Notes (Signed)
Pre visit review using our clinic review tool, if applicable. No additional management support is needed unless otherwise documented below in the visit note.  Sx started about 1 months ago.  Had pain on the L side of neck, could radiate up to the base of the skull and L shoulder.  More pain in the meantime. mose pain in the AM, better as the day goes on.  Today is the best it has been but he has taken some ibuprofen recently.  Some changes with swallowing, change in sensation/feeling with swallowing, but no frank dysphagia; "like someone had a finger pressing on my neck."  He tried sleeping with/without a pillow, no change.  Mattress and pillow both in good shape.  No R sided sx.  No voice changes.  No ear pain.    Ibuprofen last taken last night and has helped both the anterior and posterior sx.    No trauma. No rash, no bruising.  No arm weakness.  Speech wnl.  No ST.    Meds, vitals, and allergies reviewed.   ROS: See HPI.  Otherwise, noncontributory.  nad ncat Tm wnl Nasal and OP exam wnl Neck supple, no posterior midline pain No carotid bruit No TMG, thyroid not ttp L SCM ttp inferiorly L trap ttp No rash Neck w/o LA

## 2014-05-02 ENCOUNTER — Encounter: Payer: Self-pay | Admitting: Internal Medicine

## 2014-07-07 ENCOUNTER — Other Ambulatory Visit: Payer: Self-pay | Admitting: Family Medicine

## 2014-09-30 ENCOUNTER — Telehealth: Payer: Self-pay | Admitting: Family Medicine

## 2014-09-30 NOTE — Telephone Encounter (Signed)
Pt made my chart appointment See  Below Is it ok to wait  Appointment For: Chad Avila, Chad Avila ([id number])    Visit Type: MYCHART OFFICE VISIT (1064)      10/02/2014  10:45 AM 15 mins. Tonia Ghent, MD    [facility]      Patient Comments:   Office Visit   Chest and back pain, difficultly breathing, esp. at    night. Headaches and dizziness.

## 2014-09-30 NOTE — Telephone Encounter (Signed)
Phoned patient to see if he would like to be seen on Wednesday, April 6th.  No answer, Left detailed message on voicemail to call in if he would like to move his appt up, especially if these were new onset symptoms, otherwise the appt for April 7th stands.

## 2014-10-01 ENCOUNTER — Ambulatory Visit (INDEPENDENT_AMBULATORY_CARE_PROVIDER_SITE_OTHER)
Admission: RE | Admit: 2014-10-01 | Discharge: 2014-10-01 | Disposition: A | Discharge status: Home / Self Care | Payer: Managed Care, Other (non HMO) | Source: Ambulatory Visit | Attending: Family Medicine | Admitting: Family Medicine

## 2014-10-01 ENCOUNTER — Ambulatory Visit (INDEPENDENT_AMBULATORY_CARE_PROVIDER_SITE_OTHER): Payer: Managed Care, Other (non HMO) | Admitting: Family Medicine

## 2014-10-01 ENCOUNTER — Encounter: Payer: Self-pay | Admitting: Family Medicine

## 2014-10-01 VITALS — BP 160/86 | HR 64 | Temp 98.5°F | Wt 163.2 lb

## 2014-10-01 DIAGNOSIS — R0602 Shortness of breath: Secondary | ICD-10-CM

## 2014-10-01 DIAGNOSIS — B029 Zoster without complications: Secondary | ICD-10-CM | POA: Diagnosis not present

## 2014-10-01 DIAGNOSIS — E038 Other specified hypothyroidism: Secondary | ICD-10-CM | POA: Diagnosis not present

## 2014-10-01 MED ORDER — VALACYCLOVIR HCL 1 G PO TABS: 1000.0000 mg | ORAL_TABLET | Freq: Three times a day (TID) | ORAL | Status: DC

## 2014-10-01 NOTE — Progress Notes (Signed)
Pre visit review using our clinic review tool, if applicable. No additional management support is needed unless otherwise documented below in the visit note.  SOB with bilateral chest pressure/tightness esp at night.  Over the last few weeks.  He was a little lightheaded, sweaty.  He isn't SOB now but he occ feels like he occ needs to take a deep breath.  He has had some recent allergy sx.  Still sleeping flat on the bed.  He can still exert during the day ([place]1 [place] w/o sx getting worse.  No BLE edema.  Some cough in AM.    FH CAD noted.  Prev with neg cardiac eval, but that was years ago.    Yesterday he had a rash on the back, now dermatomal on the L chest wall.  Tender.  Burning pain.    Meds, vitals, and allergies reviewed.   ROS: See HPI.  Otherwise, noncontributory.  GEN: nad, alert and oriented HEENT: mucous membranes moist NECK: supple w/o LA CV: rrr.  no murmur PULM: ctab, no inc wob ABD: soft, +bs EXT: no edema SKIN: L chest wall with dermatomal rash noted

## 2014-10-01 NOTE — Patient Instructions (Addendum)
Go to the lab on the way out.  We'll contact you with your lab and xray report. Rosaria Ferries will call about your referral. Start valtrex for shingles.  Limit exertion in the meantime.  If more short of breath or chest pain (not from the shingles), then go to the ER.  Take care.  Glad to see you.

## 2014-10-01 NOTE — Assessment & Plan Note (Signed)
With T wave changes noted on EKG compared to distant previous EKG.  Refer to cards.  If CP, to ER.  Check CXR and basic labs today.  He agrees. >25 minutes spent in face to face time with patient, >50% spent in counselling or coordination of care.

## 2014-10-01 NOTE — Assessment & Plan Note (Signed)
Minimal pain, start valtrex.  Routine instructions given to patient.  He didn't need pain meds.

## 2014-10-02 ENCOUNTER — Ambulatory Visit: Payer: Self-pay | Admitting: Family Medicine

## 2014-10-02 ENCOUNTER — Ambulatory Visit (INDEPENDENT_AMBULATORY_CARE_PROVIDER_SITE_OTHER): Payer: Managed Care, Other (non HMO) | Admitting: Cardiovascular Disease

## 2014-10-02 ENCOUNTER — Encounter: Payer: Self-pay | Admitting: Cardiovascular Disease

## 2014-10-02 VITALS — BP 144/90 | HR 61 | Ht 68.0 in | Wt 162.2 lb

## 2014-10-02 DIAGNOSIS — R079 Chest pain, unspecified: Secondary | ICD-10-CM

## 2014-10-02 DIAGNOSIS — R0789 Other chest pain: Secondary | ICD-10-CM | POA: Diagnosis not present

## 2014-10-02 DIAGNOSIS — B029 Zoster without complications: Secondary | ICD-10-CM | POA: Diagnosis not present

## 2014-10-02 LAB — COMPREHENSIVE METABOLIC PANEL
ALBUMIN: 4.5 g/dL (ref 3.5–5.2)
ALK PHOS: 47 U/L (ref 39–117)
ALT: 17 U/L (ref 0–53)
AST: 17 U/L (ref 0–37)
BUN: 16 mg/dL (ref 6–23)
CALCIUM: 9.7 mg/dL (ref 8.4–10.5)
CO2: 30 mEq/L (ref 19–32)
CREATININE: 1.05 mg/dL (ref 0.40–1.50)
Chloride: 105 mEq/L (ref 96–112)
GFR: 76.96 mL/min (ref >60.00)
GLUCOSE: 94 mg/dL (ref 70–99)
POTASSIUM: 4 meq/L (ref 3.5–5.1)
Sodium: 141 mEq/L (ref 135–145)
Total Bilirubin: 1.9 mg/dL — ABNORMAL HIGH (ref 0.2–1.2)
Total Protein: 7.3 g/dL (ref 6.0–8.3)

## 2014-10-02 LAB — CBC WITH DIFFERENTIAL/PLATELET
BASOS ABS: 0 10*3/uL (ref 0.0–0.1)
Basophils Relative: 0.5 % (ref 0.0–3.0)
EOS PCT: 6.6 % — AB (ref 0.0–5.0)
Eosinophils Absolute: 0.4 10*3/uL (ref 0.0–0.7)
HEMATOCRIT: 41.1 % (ref 39.0–52.0)
HEMOGLOBIN: 14.2 g/dL (ref 13.0–17.0)
LYMPHS ABS: 1.9 10*3/uL (ref 0.7–4.0)
Lymphocytes Relative: 31.3 % (ref 12.0–46.0)
MCHC: 34.5 g/dL (ref 30.0–36.0)
MCV: 86.8 fl (ref 78.0–100.0)
MONO ABS: 0.4 10*3/uL (ref 0.1–1.0)
MONOS PCT: 7 % (ref 3.0–12.0)
NEUTROS PCT: 54.6 % (ref 43.0–77.0)
Neutro Abs: 3.2 10*3/uL (ref 1.4–7.7)
Platelets: 242 10*3/uL (ref 150.0–400.0)
RBC: 4.74 Mil/uL (ref 4.22–5.81)
RDW: 14 % (ref 11.5–15.5)
WBC: 5.9 10*3/uL (ref 4.0–10.5)

## 2014-10-02 LAB — BRAIN NATRIURETIC PEPTIDE: PRO B NATRI PEPTIDE: 38 pg/mL (ref 0.0–100.0)

## 2014-10-02 LAB — TSH: TSH: 10.82 u[IU]/mL — ABNORMAL HIGH (ref 0.35–4.50)

## 2014-10-02 NOTE — Assessment & Plan Note (Signed)
The patient was started on Valtrex yesterday.

## 2014-10-02 NOTE — Patient Instructions (Addendum)
[place]  Your caregiver has ordered a Stress Test with nuclear imaging. The purpose of this test is to evaluate the blood supply to your heart muscle. This procedure is referred to as a "Non-Invasive Stress Test." This is because other than having an IV started in your vein, nothing is inserted or "invades" your body. Cardiac stress tests are done to find areas of poor blood flow to the heart by determining the extent of coronary artery disease (CAD). Some patients exercise on a treadmill, which naturally increases the blood flow to your heart, while others who are  unable to walk on a treadmill due to physical limitations have a pharmacologic/chemical stress agent called Lexiscan . This medicine will mimic walking on a treadmill by temporarily increasing your coronary blood flow.   Please note: these test may take anywhere between 2-4 hours to complete  PLEASE REPORT TO [facility] AT THE FIRST DESK WILL DIRECT YOU WHERE TO GO  Date of Procedure:_____Tuesday, April 12_____  Arrival Time for Procedure:______7:15 am________   PLEASE NOTIFY THE OFFICE AT LEAST 24 HOURS IN ADVANCE IF YOU ARE UNABLE TO KEEP YOUR APPOINTMENT.  [phone] AND  PLEASE NOTIFY NUCLEAR MEDICINE AT [facility] AT LEAST 24 HOURS IN ADVANCE IF YOU ARE UNABLE TO KEEP YOUR APPOINTMENT. [phone]  How to prepare for your Myoview test:  1. Do not eat or drink after midnight 2. No caffeine for 24 hours prior to test 3. No smoking 24 hours prior to test. 4. Your medication may be taken with water.  If your doctor stopped a medication because of this test, do not take that medication. 5. Ladies, please do not wear dresses.  Skirts or pants are appropriate. Please wear a short sleeve shirt. 6. No perfume, cologne or lotion. 7. Wear comfortable walking shoes. No heels!  Call or return to clinic prn if these symptoms worsen or fail to improve as anticipated.

## 2014-10-02 NOTE — Assessment & Plan Note (Signed)
The chest pain is overall atypical and likely due to shingles. However, he has an abnormal EKG with nonspecific ST depression. Due to that, I recommend evaluation with a treadmill nuclear stress test. Given his abnormal EKG, a treadmill stress test alone is not interpretable.

## 2014-10-02 NOTE — Progress Notes (Signed)
Primary care physician: Dr. Damita Dunnings  HPI  This is a pleasant 59 year old man who was referred for evaluation of chest pain and abnormal EKG. He has no previous cardiac history. He has no history of diabetes, hypertension or hyperlipidemia. He is not a smoker and has no family history of premature coronary artery disease. He had previous stress test in 2008 which was normal. Recently, he started having substernal chest tightness radiating around to his back. This was associated with shortness of breath and fatigue. He was noted yesterday to have rash suggestive of shingles in the same area and was started on Valtrex. He was noted to have an abnormal EKG and thus was referred for evaluation.  No Known Allergies   Current Outpatient Prescriptions on File Prior to Visit  Medication Sig Dispense Refill  . ALPRAZolam (XANAX XR) 0.5 MG 24 hr tablet Take 0.5 mg by mouth daily as needed. For anxiety. 30 tablet 1  . fluticasone (FLONASE) 50 MCG/ACT nasal spray USE 2 SPRAYS INTO EACH NOSTRIL ONCE A DAY FOR RHINITS 16 g 3  . levothyroxine (SYNTHROID, LEVOTHROID) 175 MCG tablet Take 1 tablet every day except for 1 and 1/2 tablets on Sunday. 35 tablet 12  . Multiple Vitamin (MULTIVITAMIN) tablet Take 1 tablet by mouth daily.      . tadalafil (CIALIS) 5 MG tablet Take 1 tablet (5 mg total) by mouth daily as needed for erectile dysfunction. 30 tablet 3  . valACYclovir (VALTREX) 1000 MG tablet Take 1 tablet (1,000 mg total) by mouth 3 (three) times daily. 21 tablet 0   No current facility-administered medications on file prior to visit.     Past Medical History  Diagnosis Date  . Chest pain 07/26/06    Stress myoview, NSST/TW changes, low risk  . Hyperlipemia   . Epididymitis   . Hyperglycemia   . Gilbert syndrome   . Degenerative joint disease     Mild, L 4/5, L 5/6 VIA X-Ray  . Gastritis   . Helicobacter pylori (H. pylori)   . Hypercholesterolemia   . Hyperplastic colonic polyp   . Organic  impotence   . Claustrophobia   . Hypothyroidism   . Shingles      Past Surgical History  Procedure Laterality Date  . Ventral hernia repair      As a child  . Tonsillectomy      As a child  . Fracture surgery      Left leg, as a child  . Hosp      Gastritis in AF ([place], [place])  . Ett  06/12/2000    Negative ([facility])  . Stress myoview  07/26/2006    NSST/T W changes, low risk  . Colonoscopy  03/22/2007    Benign polyps, Dr. Henrene Pastor, 10 yrs  . Herniorraphy  03/22/2011    Dr Bary Castilla     Family History  Problem Relation Age of Onset  . Arthritis Mother     Osteroporosis  . Diabetes Mother   . Hypertension Mother     Mother's side, strokes  . Heart disease Mother 96    MI  12/11  . Osteoporosis Mother   . Stroke Mother   . Heart disease Paternal Grandfather     MI  . Hypertension Other     Mother's family  . Stroke Other     Mother's side  . Heart disease Father   . Prostate cancer Neg Hx   . Colon cancer Maternal Uncle  History   Social History  . Marital Status: Single    Spouse Name: N/A  . Number of Children: 1  . Years of Education: N/A   Occupational History  . Systems engineering specialist [employer]   Social History Main Topics  . Smoking status: Former Smoker    Quit date: 06/27/1986  . Smokeless tobacco: Never Used  . Alcohol Use: 0.0 oz/week    0 Standard drinks or equivalent per week     Comment: beer weekly  . Drug Use: No     Comment: Last in '76  . Sexual Activity: Not on file   Other Topics Concern  . Not on file   Social History Narrative   Divorced with 1 daughter and 1 son   Gets regular exercise   Equipment/maintenance at [employer]   Enjoys working in the yard and Freight forwarder     ROS A 10 point review of system was performed. It is negative other than that mentioned in the history of present illness.   PHYSICAL EXAM   BP 144/90 mmHg  Pulse 61  Ht 5\' 8"  (1.727 m)  Wt 162 lb 4 oz  (73.596 kg)  BMI 24.68 kg/m2 Constitutional: He is oriented to person, place, and time. He appears well-developed and well-nourished. No distress.  HENT: No nasal discharge.  Head: Normocephalic and atraumatic.  Eyes: Pupils are equal and round.  No discharge. Neck: Normal range of motion. Neck supple. No JVD present. No thyromegaly present.  Cardiovascular: Normal rate, regular rhythm, normal heart sounds. Exam reveals no gallop and no friction rub. No murmur heard.  Pulmonary/Chest: Effort normal and breath sounds normal. No stridor. No respiratory distress. He has no wheezes. He has no rales. He exhibits no tenderness.  Abdominal: Soft. Bowel sounds are normal. He exhibits no distension. There is no tenderness. There is no rebound and no guarding.  Musculoskeletal: Normal range of motion. He exhibits no edema and no tenderness.  Neurological: He is alert and oriented to person, place, and time. Coordination normal.  Skin: Skin is warm and dry. There is a papular rash in the front chest area extending to the back. He is very tender in these areas. Marland Kitchen He is not diaphoretic. No erythema. No pallor.  Psychiatric: He has a normal mood and affect. His behavior is normal. Judgment and thought content normal.       WYO:VZCHY  Rhythm  Possible left ventricular hypertrophy on non-voltage basis.   -Nonspecific ST depression   +   Nonspecific T-abnormality  -Seen with left ventricular hypertrophy (strain) or digitalis effect.   ABNORMAL     ASSESSMENT AND PLAN

## 2014-10-03 ENCOUNTER — Other Ambulatory Visit: Payer: Self-pay | Admitting: Family Medicine

## 2014-10-03 DIAGNOSIS — E038 Other specified hypothyroidism: Secondary | ICD-10-CM

## 2014-10-03 MED ORDER — LEVOTHYROXINE SODIUM 200 MCG PO TABS: 200.0000 ug | ORAL_TABLET | Freq: Every day | ORAL | Status: DC

## 2014-10-06 ENCOUNTER — Encounter: Payer: Self-pay | Admitting: Family Medicine

## 2014-10-07 ENCOUNTER — Other Ambulatory Visit: Payer: Self-pay

## 2014-10-07 ENCOUNTER — Ambulatory Visit
Admit: 2014-10-07 | Disposition: A | Discharge status: Home / Self Care | Payer: Self-pay | Attending: Cardiovascular Disease | Admitting: Cardiovascular Disease

## 2014-10-07 DIAGNOSIS — R079 Chest pain, unspecified: Secondary | ICD-10-CM | POA: Diagnosis not present

## 2014-10-14 ENCOUNTER — Encounter: Payer: Self-pay | Admitting: Family Medicine

## 2015-07-26 ENCOUNTER — Other Ambulatory Visit: Payer: Self-pay | Admitting: Family Medicine

## 2015-07-26 DIAGNOSIS — E039 Hypothyroidism, unspecified: Secondary | ICD-10-CM

## 2015-07-26 DIAGNOSIS — Z125 Encounter for screening for malignant neoplasm of prostate: Secondary | ICD-10-CM

## 2015-07-26 DIAGNOSIS — R7309 Other abnormal glucose: Secondary | ICD-10-CM

## 2015-07-30 ENCOUNTER — Other Ambulatory Visit (INDEPENDENT_AMBULATORY_CARE_PROVIDER_SITE_OTHER): Payer: Managed Care, Other (non HMO)

## 2015-07-30 DIAGNOSIS — Z125 Encounter for screening for malignant neoplasm of prostate: Secondary | ICD-10-CM

## 2015-07-30 DIAGNOSIS — R7309 Other abnormal glucose: Secondary | ICD-10-CM | POA: Diagnosis not present

## 2015-07-30 DIAGNOSIS — E039 Hypothyroidism, unspecified: Secondary | ICD-10-CM | POA: Diagnosis not present

## 2015-07-30 LAB — COMPREHENSIVE METABOLIC PANEL
ALT: 18 U/L (ref 0–53)
AST: 15 U/L (ref 0–37)
Albumin: 4.5 g/dL (ref 3.5–5.2)
Alkaline Phosphatase: 46 U/L (ref 39–117)
BUN: 13 mg/dL (ref 6–23)
CO2: 30 mEq/L (ref 19–32)
CREATININE: 0.91 mg/dL (ref 0.40–1.50)
Calcium: 9.3 mg/dL (ref 8.4–10.5)
Chloride: 105 mEq/L (ref 96–112)
GFR: 90.52 mL/min (ref >60.00)
GLUCOSE: 116 mg/dL — AB (ref 70–99)
POTASSIUM: 4.5 meq/L (ref 3.5–5.1)
Sodium: 142 mEq/L (ref 135–145)
TOTAL PROTEIN: 6.9 g/dL (ref 6.0–8.3)
Total Bilirubin: 2.1 mg/dL — ABNORMAL HIGH (ref 0.2–1.2)

## 2015-07-30 LAB — LIPID PANEL
Cholesterol: 172 mg/dL (ref 0–200)
HDL: 56.5 mg/dL (ref >39.00)
LDL Cholesterol: 107 mg/dL — ABNORMAL HIGH (ref 0–99)
NONHDL: 115.56
Total CHOL/HDL Ratio: 3
Triglycerides: 43 mg/dL (ref 0.0–149.0)
VLDL: 8.6 mg/dL (ref 0.0–40.0)

## 2015-07-30 LAB — TSH: TSH: 1.79 u[IU]/mL (ref 0.35–4.50)

## 2015-07-30 LAB — PSA: PSA: 1.31 ng/mL (ref 0.10–4.00)

## 2015-08-06 ENCOUNTER — Encounter: Payer: Self-pay | Admitting: Family Medicine

## 2015-08-06 ENCOUNTER — Ambulatory Visit (INDEPENDENT_AMBULATORY_CARE_PROVIDER_SITE_OTHER): Payer: Managed Care, Other (non HMO) | Admitting: Family Medicine

## 2015-08-06 VITALS — BP 140/86 | HR 72 | Temp 98.8°F | Ht 68.0 in | Wt 163.2 lb

## 2015-08-06 DIAGNOSIS — Z0000 Encounter for general adult medical examination without abnormal findings: Secondary | ICD-10-CM

## 2015-08-06 DIAGNOSIS — IMO0001 Reserved for inherently not codable concepts without codable children: Secondary | ICD-10-CM

## 2015-08-06 DIAGNOSIS — E039 Hypothyroidism, unspecified: Secondary | ICD-10-CM

## 2015-08-06 DIAGNOSIS — R7309 Other abnormal glucose: Secondary | ICD-10-CM

## 2015-08-06 DIAGNOSIS — R030 Elevated blood-pressure reading, without diagnosis of hypertension: Secondary | ICD-10-CM

## 2015-08-06 DIAGNOSIS — Z119 Encounter for screening for infectious and parasitic diseases, unspecified: Secondary | ICD-10-CM

## 2015-08-06 MED ORDER — LEVOTHYROXINE SODIUM 200 MCG PO TABS: 200.0000 ug | ORAL_TABLET | Freq: Every day | ORAL | Status: DC

## 2015-08-06 NOTE — Patient Instructions (Addendum)
I would get a flu shot when you feel better.   If you BP stays up, then let me know.  Take care.  Glad to see you.

## 2015-08-06 NOTE — Assessment & Plan Note (Signed)
tsh wnl, no tmg on exam, continue as is.  tsh d/w pt.

## 2015-08-06 NOTE — Assessment & Plan Note (Addendum)
Tetanus 2014  PNA and shingles shot not due. D/w pt.  Flu shot can be done when well.  Colonoscopy 2008  PSA wnl d/w pt.  Living will d/w pt. Son designated if patient were incapacitated.  Diet and exercise d/w pt. Encouraged both.  Pt opts in for HCV screening. D/w pt re: routine screening.  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  Mild URI sx, benign exam sinuses not ttp, he'll continue supportive care.  Is some better today, will update me as needed.  He agrees.

## 2015-08-06 NOTE — Assessment & Plan Note (Signed)
Incidental, continue diet and exercise. I'm not certain this was a true reading as his lipids are improved.  Recheck yearly, continue D&E.  He agrees.  D/w pt re: labs.

## 2015-08-06 NOTE — Assessment & Plan Note (Signed)
He'll update me if BP stays up.

## 2015-08-06 NOTE — Progress Notes (Signed)
Pre visit review using our clinic review tool, if applicable. No additional management support is needed unless otherwise documented below in the visit note.  CPE- See plan.  Routine anticipatory guidance given to patient.  See health maintenance. Tetanus 2014 PNA and shingles shot not due. D/w pt.  Flu shot can be done when well.   Colonoscopy 2008 PSA wnl d/w pt.   Living will d/w pt. Son designated if patient were incapacitated.  Diet and exercise d/w pt. Encouraged both.  Pt opts in for HCV screening.  D/w pt re: routine screening.   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   His girlfriend is going through treatment for pancreatic cancer.  I offered my condolences.  He is also caring for his parents.   Hypothyroidism.  No ADE.  Complaint.  No neck mass, dysphagia, etc.  tsh d/w pt.   Recently with a cough, some better in the last few days.  No fevers.  Some dec in cough.  Still with ST.  Using otc cold meds.    PMH and SH reviewed  Meds, vitals, and allergies reviewed.   ROS: See HPI.  Otherwise negative.    GEN: nad, alert and oriented HEENT: mucous membranes moist, tm w/o erythema, nasal exam w/o erythema, clear discharge noted,  OP with cobblestoning NECK: supple w/o LA, no tmg CV: rrr.   PULM: ctab, no inc wob EXT: no edema SKIN: no acute rash

## 2015-09-04 ENCOUNTER — Encounter: Payer: Self-pay | Admitting: Family Medicine

## 2015-09-04 ENCOUNTER — Ambulatory Visit (INDEPENDENT_AMBULATORY_CARE_PROVIDER_SITE_OTHER): Payer: Managed Care, Other (non HMO) | Admitting: Family Medicine

## 2015-09-04 VITALS — BP 142/88 | HR 68 | Temp 98.4°F | Wt 163.5 lb

## 2015-09-04 DIAGNOSIS — R109 Unspecified abdominal pain: Secondary | ICD-10-CM | POA: Diagnosis not present

## 2015-09-04 DIAGNOSIS — R300 Dysuria: Secondary | ICD-10-CM

## 2015-09-04 DIAGNOSIS — R319 Hematuria, unspecified: Secondary | ICD-10-CM | POA: Diagnosis not present

## 2015-09-04 LAB — POC URINALSYSI DIPSTICK (AUTOMATED)
BILIRUBIN UA: NEGATIVE
Blood, UA: NEGATIVE
Glucose, UA: NEGATIVE
Ketones, UA: NEGATIVE
LEUKOCYTES UA: NEGATIVE
NITRITE UA: NEGATIVE
PH UA: 6
PROTEIN UA: 0.15
Spec Grav, UA: >=1.03
Urobilinogen, UA: 4

## 2015-09-04 NOTE — Patient Instructions (Signed)
Drop off some more urine on Monday in the lab container.   Update me, good/bad/indifferent.  Ibuprofen for pain, with plenty of fluids.  We'll contact you with your lab report.  Take care.  Glad to see you.

## 2015-09-04 NOTE — Progress Notes (Signed)
Pre visit review using our clinic review tool, if applicable. No additional management support is needed unless otherwise documented below in the visit note.  Sx started about 2 weeks ago.  Lower back pain, bilateral lower back from the ribs down to the belt line.  3-4 day of pain, then it has moved down into the groin, still bilateral.  Back pain is better in the meantime.  Waxing and waning pain.  No FCNAVD.  Yesterday and today with black specks in the urine.  No burning with urination.  No h/o renal stones.  Stream is still okay.  Pain is minimal now.  On baseline meds.  No trauma, no trigger known for sx.    Meds, vitals, and allergies reviewed.   ROS: See HPI.  Otherwise, noncontributory.  nad ncat Mmm Neck supple, no LA rrr ctab Back not ttp RLQ not ttp, abd benign, no rebound.  Normal BS No cva pain.  Ext well perfused.

## 2015-09-06 DIAGNOSIS — R109 Unspecified abdominal pain: Secondary | ICD-10-CM | POA: Insufficient documentation

## 2015-09-06 LAB — URINE CULTURE
Colony Count: NO GROWTH
Organism ID, Bacteria: NO GROWTH

## 2015-09-06 NOTE — Assessment & Plan Note (Signed)
It was B lower back pain, then B lower abd/groin pain.  Clearly migrated, clearly B.  He noted some black specks in his urine.  He doesn't recall passing as stone.  We didn't image him because if this is from a stone and he has already had that much movement then he would likely pass it on his own, he agrees with holding off on imaging.  See notes on u/a and ucx.  I want him to drop off repeat urine sample Monday for recheck u/a.  He can update on his condition then.  Still okay for outpatient f/u, d/w pt.  He agrees.

## 2015-09-07 LAB — POC URINALSYSI DIPSTICK (AUTOMATED)
Bilirubin, UA: NEGATIVE
Glucose, UA: NEGATIVE
Ketones, UA: NEGATIVE
LEUKOCYTES UA: NEGATIVE
NITRITE UA: NEGATIVE
PH UA: 7
PROTEIN UA: NEGATIVE
RBC UA: NEGATIVE
Spec Grav, UA: 1.025
UROBILINOGEN UA: 4

## 2015-09-07 NOTE — Addendum Note (Signed)
Addended by: Josetta Huddle on: 09/07/2015 10:35 AM   Modules accepted: Orders

## 2015-09-25 ENCOUNTER — Encounter: Payer: Self-pay | Admitting: Family Medicine

## 2015-09-30 ENCOUNTER — Encounter: Payer: Self-pay | Admitting: Family Medicine

## 2015-10-02 ENCOUNTER — Other Ambulatory Visit: Payer: Self-pay | Admitting: Family Medicine

## 2015-10-02 MED ORDER — FLUTICASONE PROPIONATE 50 MCG/ACT NA SUSP: NASAL | Status: DC

## 2016-04-06 ENCOUNTER — Ambulatory Visit (INDEPENDENT_AMBULATORY_CARE_PROVIDER_SITE_OTHER): Payer: Managed Care, Other (non HMO) | Admitting: Family Medicine

## 2016-04-06 ENCOUNTER — Encounter: Payer: Self-pay | Admitting: Family Medicine

## 2016-04-06 VITALS — BP 144/102 | HR 60 | Temp 98.6°F | Wt 164.8 lb

## 2016-04-06 DIAGNOSIS — R109 Unspecified abdominal pain: Secondary | ICD-10-CM | POA: Diagnosis not present

## 2016-04-06 DIAGNOSIS — R140 Abdominal distension (gaseous): Secondary | ICD-10-CM

## 2016-04-06 LAB — POC URINALSYSI DIPSTICK (AUTOMATED)
BILIRUBIN UA: NEGATIVE
Blood, UA: NEGATIVE
GLUCOSE UA: NEGATIVE
Ketones, UA: NEGATIVE
Leukocytes, UA: NEGATIVE
NITRITE UA: NEGATIVE
Protein, UA: NEGATIVE
Spec Grav, UA: 1.015
UROBILINOGEN UA: NEGATIVE
pH, UA: 6

## 2016-04-06 MED ORDER — SUCRALFATE 1 G PO TABS: 1.0000 g | ORAL_TABLET | Freq: Three times a day (TID) | ORAL | 0 refills | Status: DC

## 2016-04-06 NOTE — Patient Instructions (Addendum)
Go to the lab on the way out.  We'll contact you with your lab report. Add on sucralfate for now.  See if that helps.  Either way, let me now.  Take care.  Glad to see you.

## 2016-04-06 NOTE — Progress Notes (Signed)
Abd pain.  Had been going on for months.  Upper abd and along posterior L lower sibs.  Now is nearly constant but not severe.  Worse bloating with eating or drinking.  Can radiate to the bilateral lower abd.  He has burning along the upper abd.  Burping helps the abd sx.    He doesn't have dysphagia.  He cut out coffee and tried prilosec for about 14 days with very little improvement.  He is still off coffee.    Rare, minimal OTC nsaids.  No blood in stool.  No blood in urine.  No black specks in urine.  Prev u/a w/u noted, d/w pt, that was back in 08/2015.  No more urinary sx in the meantime. No fevers.    Had a flu shot recently.    His girlfriend died of pancreatic cancer this year.  Condolences offered.  He is still caring for his parents.    Meds, vitals, and allergies reviewed.   ROS: Per HPI unless specifically indicated in ROS section   GEN: nad, alert and oriented HEENT: mucous membranes moist NECK: supple w/o LA CV: rrr.  PULM: ctab, no inc wob ABD: soft, +bs, slightly ttp in the B upper abd along the lower ribs, no rebound, no CVA pain EXT: no edema SKIN: no acute rash

## 2016-04-07 DIAGNOSIS — R140 Abdominal distension (gaseous): Secondary | ICD-10-CM | POA: Insufficient documentation

## 2016-04-07 LAB — CBC WITH DIFFERENTIAL/PLATELET
BASOS ABS: 0.1 10*3/uL (ref 0.0–0.1)
Basophils Relative: 0.8 % (ref 0.0–3.0)
EOS ABS: 0.3 10*3/uL (ref 0.0–0.7)
Eosinophils Relative: 4.8 % (ref 0.0–5.0)
HEMATOCRIT: 42.6 % (ref 39.0–52.0)
Hemoglobin: 14.6 g/dL (ref 13.0–17.0)
LYMPHS PCT: 33.6 % (ref 12.0–46.0)
Lymphs Abs: 2.2 10*3/uL (ref 0.7–4.0)
MCHC: 34.3 g/dL (ref 30.0–36.0)
MCV: 87.9 fl (ref 78.0–100.0)
MONOS PCT: 7.7 % (ref 3.0–12.0)
Monocytes Absolute: 0.5 10*3/uL (ref 0.1–1.0)
Neutro Abs: 3.4 10*3/uL (ref 1.4–7.7)
Neutrophils Relative %: 53.1 % (ref 43.0–77.0)
PLATELETS: 246 10*3/uL (ref 150.0–400.0)
RBC: 4.84 Mil/uL (ref 4.22–5.81)
RDW: 14.1 % (ref 11.5–15.5)
WBC: 6.4 10*3/uL (ref 4.0–10.5)

## 2016-04-07 LAB — COMPREHENSIVE METABOLIC PANEL
ALBUMIN: 4.9 g/dL (ref 3.5–5.2)
ALK PHOS: 44 U/L (ref 39–117)
ALT: 20 U/L (ref 0–53)
AST: 17 U/L (ref 0–37)
BILIRUBIN TOTAL: 2.5 mg/dL — AB (ref 0.2–1.2)
BUN: 12 mg/dL (ref 6–23)
CALCIUM: 9.6 mg/dL (ref 8.4–10.5)
CO2: 31 mEq/L (ref 19–32)
CREATININE: 0.91 mg/dL (ref 0.40–1.50)
Chloride: 102 mEq/L (ref 96–112)
GFR: 90.31 mL/min (ref >60.00)
Glucose, Bld: 87 mg/dL (ref 70–99)
Potassium: 3.5 mEq/L (ref 3.5–5.1)
Sodium: 140 mEq/L (ref 135–145)
TOTAL PROTEIN: 7.7 g/dL (ref 6.0–8.3)

## 2016-04-07 LAB — LIPASE: Lipase: 19 U/L (ref 11.0–59.0)

## 2016-04-07 NOTE — Assessment & Plan Note (Addendum)
Discussed with patient about options. Check routine labs today. See notes on labs. He is slightly tender in the upper abdomen, but otherwise has a benign abdominal exam. He does not have rebound. He does not have any sign of an acute abdomen. He could be having symptoms related to his gallbladder. It could be reflux related. Unclear how much this is exacerbated by significant life stressors, discussed above. I will await his routine labs. Add on sucralfate in the meantime, just to see if that makes a difference. He will update me. If sucralfate does not help and if his labs are unremarkable, then we can talk about referral to GI for imaging his abdomen. I did not put in the referral or imaging yet. He will update me. He agrees with plan. Okay for outpatient follow-up. >25 minutes spent in face to face time with patient, >50% spent in counselling or coordination of care.

## 2016-04-13 ENCOUNTER — Encounter: Payer: Self-pay | Admitting: Family Medicine

## 2016-05-03 ENCOUNTER — Encounter: Payer: Self-pay | Admitting: Family Medicine

## 2016-05-04 ENCOUNTER — Other Ambulatory Visit: Payer: Self-pay | Admitting: Family Medicine

## 2016-05-04 MED ORDER — TADALAFIL 5 MG PO TABS: 5.0000 mg | ORAL_TABLET | Freq: Every day | ORAL | 3 refills | Status: DC | PRN

## 2016-05-06 ENCOUNTER — Other Ambulatory Visit: Payer: Self-pay | Admitting: Family Medicine

## 2016-05-06 MED ORDER — SILDENAFIL CITRATE 20 MG PO TABS: 60.0000 mg | ORAL_TABLET | Freq: Every day | ORAL | 99 refills | Status: DC | PRN

## 2016-09-27 ENCOUNTER — Other Ambulatory Visit: Payer: Self-pay | Admitting: Family Medicine

## 2016-11-02 ENCOUNTER — Other Ambulatory Visit: Payer: Self-pay | Admitting: Family Medicine

## 2016-11-15 ENCOUNTER — Ambulatory Visit (INDEPENDENT_AMBULATORY_CARE_PROVIDER_SITE_OTHER): Payer: Managed Care, Other (non HMO) | Admitting: Family Medicine

## 2016-11-15 ENCOUNTER — Encounter: Payer: Self-pay | Admitting: Family Medicine

## 2016-11-15 VITALS — BP 144/86 | HR 72 | Temp 98.7°F | Ht 68.0 in | Wt 167.5 lb

## 2016-11-15 DIAGNOSIS — Z1211 Encounter for screening for malignant neoplasm of colon: Secondary | ICD-10-CM

## 2016-11-15 DIAGNOSIS — E785 Hyperlipidemia, unspecified: Secondary | ICD-10-CM

## 2016-11-15 DIAGNOSIS — Z1159 Encounter for screening for other viral diseases: Secondary | ICD-10-CM

## 2016-11-15 DIAGNOSIS — Z0000 Encounter for general adult medical examination without abnormal findings: Secondary | ICD-10-CM

## 2016-11-15 DIAGNOSIS — E039 Hypothyroidism, unspecified: Secondary | ICD-10-CM

## 2016-11-15 LAB — LIPID PANEL
CHOL/HDL RATIO: 4
Cholesterol: 193 mg/dL (ref 0–200)
HDL: 55 mg/dL (ref >39.00)
LDL CALC: 125 mg/dL — AB (ref 0–99)
NONHDL: 137.76
TRIGLYCERIDES: 64 mg/dL (ref 0.0–149.0)
VLDL: 12.8 mg/dL (ref 0.0–40.0)

## 2016-11-15 LAB — COMPREHENSIVE METABOLIC PANEL
ALT: 19 U/L (ref 0–53)
AST: 16 U/L (ref 0–37)
Albumin: 4.6 g/dL (ref 3.5–5.2)
Alkaline Phosphatase: 41 U/L (ref 39–117)
BILIRUBIN TOTAL: 2.2 mg/dL — AB (ref 0.2–1.2)
BUN: 19 mg/dL (ref 6–23)
CHLORIDE: 103 meq/L (ref 96–112)
CO2: 28 meq/L (ref 19–32)
CREATININE: 0.99 mg/dL (ref 0.40–1.50)
Calcium: 9.3 mg/dL (ref 8.4–10.5)
GFR: 81.77 mL/min (ref >60.00)
GLUCOSE: 86 mg/dL (ref 70–99)
Potassium: 3.8 mEq/L (ref 3.5–5.1)
Sodium: 141 mEq/L (ref 135–145)
Total Protein: 7.2 g/dL (ref 6.0–8.3)

## 2016-11-15 LAB — TSH: TSH: 3.76 u[IU]/mL (ref 0.35–4.50)

## 2016-11-15 MED ORDER — LEVOTHYROXINE SODIUM 200 MCG PO TABS: ORAL_TABLET | ORAL | 3 refills | Status: DC

## 2016-11-15 MED ORDER — FLUTICASONE PROPIONATE 50 MCG/ACT NA SUSP: NASAL | 12 refills | Status: DC

## 2016-11-15 MED ORDER — SILDENAFIL CITRATE 20 MG PO TABS: 60.0000 mg | ORAL_TABLET | Freq: Every day | ORAL | 99 refills | Status: DC | PRN

## 2016-11-15 NOTE — Patient Instructions (Addendum)
Check with your insurance to see if they will cover the shingles shot. Go to the lab on the way out.  We'll contact you with your lab report. Chad Avila will call about your referral. Take care.  Glad to see you.  Update me as needed.

## 2016-11-15 NOTE — Progress Notes (Signed)
CPE- See plan.  Routine anticipatory guidance given to patient.  See health maintenance.  The possibility exists that previously documented standard health maintenance information may have been brought forward from a previous encounter into this note.  If needed, that same information has been updated to reflect the current situation based on today's encounter.    Tetanus October 19, 2012  PNA shot not due. D/w pt.  Shingles d/w pt.  See AVS.   Flu shot can be done in the fall  Colonoscopy Oct 20, 2006, due for repeat.   PSA prev wnl, defer as of 2016-10-19, d/w pt about pros and cons.   Living will d/w pt. Son designated if patient were incapacitated.  Diet and exercise d/w pt. Encouraged both.  Pt opts in for HCV screening.  D/w pt re: routine screening.   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  Labs pending.     His prev abx sx resolved w/o return.  Off carafate currently.    Hypothyroidism.  Compliant usually.  No ADE on med.  No neck mass.  No dysphagia.  TSH pending.  Had missed a few doses in the last 2 weeks.    His girlfriend died of pancreatic cancer October 20, 2015.  He is trying to adjust to the change.  Condolences offered.  He is still caring for his parents.    PMH and SH reviewed  Meds, vitals, and allergies reviewed.   ROS: Per HPI.  Unless specifically indicated otherwise in HPI, the patient denies:  General: fever. Eyes: acute vision changes ENT: sore throat Cardiovascular: chest pain Respiratory: SOB GI: vomiting GU: dysuria Musculoskeletal: acute back pain Derm: acute rash Neuro: acute motor dysfunction Psych: worsening mood Endocrine: polydipsia Heme: bleeding Allergy: hayfever  GEN: nad, alert and oriented HEENT: mucous membranes moist NECK: supple w/o LA, no tmg CV: rrr. PULM: ctab, no inc wob ABD: soft, +bs EXT: no edema SKIN: no acute rash

## 2016-11-16 LAB — HEPATITIS C ANTIBODY: HCV Ab: NEGATIVE

## 2016-11-16 NOTE — Assessment & Plan Note (Signed)
Tetanus 2014  PNA shot not due. D/w pt.  Shingles d/w pt.  See AVS.   Flu shot can be done in the fall  Colonoscopy 2008, due for repeat.  D/w pt.   PSA prev wnl, defer as of 2018, d/w pt about pros and cons.   Living will d/w pt. Son designated if patient were incapacitated.  Diet and exercise d/w pt. Encouraged both.  Pt opts in for HCV screening.  D/w pt re: routine screening.   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  Labs pending.

## 2016-11-16 NOTE — Assessment & Plan Note (Signed)
Noted that he had missed a few doses of medication recently. No change in medication at this point. See notes on labs. He agrees.

## 2017-01-30 ENCOUNTER — Encounter: Payer: Self-pay | Admitting: Family Medicine

## 2017-03-29 ENCOUNTER — Ambulatory Visit (AMBULATORY_SURGERY_CENTER): Payer: Self-pay | Admitting: *Deleted

## 2017-03-29 VITALS — Ht 68.0 in | Wt 167.0 lb

## 2017-03-29 DIAGNOSIS — Z1211 Encounter for screening for malignant neoplasm of colon: Secondary | ICD-10-CM

## 2017-03-29 MED ORDER — NA SULFATE-K SULFATE-MG SULF 17.5-3.13-1.6 GM/177ML PO SOLN: ORAL | 0 refills | Status: DC

## 2017-03-29 NOTE — Progress Notes (Signed)
Patient denies any allergies to eggs or soy. Patient denies any problems with anesthesia/sedation. Patient denies any oxygen use at home. Patient denies taking any diet/weight loss medications or blood thinners. EMMI education assisgned to patient on colonoscopy, this was explained and instructions given to patient. 

## 2017-03-31 ENCOUNTER — Encounter: Payer: Self-pay | Admitting: Internal Medicine

## 2017-04-07 ENCOUNTER — Encounter: Payer: Self-pay | Admitting: Family Medicine

## 2017-04-07 ENCOUNTER — Other Ambulatory Visit: Payer: Self-pay | Admitting: Family Medicine

## 2017-04-07 MED ORDER — SUCRALFATE 1 G PO TABS: 1.0000 g | ORAL_TABLET | Freq: Three times a day (TID) | ORAL | 1 refills | Status: DC

## 2017-04-12 ENCOUNTER — Ambulatory Visit (AMBULATORY_SURGERY_CENTER): Payer: Managed Care, Other (non HMO) | Admitting: Internal Medicine

## 2017-04-12 ENCOUNTER — Other Ambulatory Visit: Payer: Self-pay | Admitting: Internal Medicine

## 2017-04-12 ENCOUNTER — Encounter: Payer: Self-pay | Admitting: Family Medicine

## 2017-04-12 ENCOUNTER — Ambulatory Visit (INDEPENDENT_AMBULATORY_CARE_PROVIDER_SITE_OTHER): Payer: Managed Care, Other (non HMO) | Admitting: Family Medicine

## 2017-04-12 ENCOUNTER — Encounter: Payer: Self-pay | Admitting: Internal Medicine

## 2017-04-12 VITALS — BP 158/98 | HR 81 | Temp 98.0°F | Wt 163.5 lb

## 2017-04-12 VITALS — BP 176/110 | HR 71 | Temp 99.1°F | Resp 19 | Ht 68.0 in | Wt 167.0 lb

## 2017-04-12 DIAGNOSIS — E039 Hypothyroidism, unspecified: Secondary | ICD-10-CM | POA: Diagnosis not present

## 2017-04-12 DIAGNOSIS — R7309 Other abnormal glucose: Secondary | ICD-10-CM | POA: Diagnosis not present

## 2017-04-12 DIAGNOSIS — D122 Benign neoplasm of ascending colon: Secondary | ICD-10-CM

## 2017-04-12 DIAGNOSIS — Z1211 Encounter for screening for malignant neoplasm of colon: Secondary | ICD-10-CM | POA: Diagnosis present

## 2017-04-12 DIAGNOSIS — I10 Essential (primary) hypertension: Secondary | ICD-10-CM | POA: Diagnosis not present

## 2017-04-12 LAB — COMPREHENSIVE METABOLIC PANEL
ALT: 15 U/L (ref 0–53)
AST: 14 U/L (ref 0–37)
Albumin: 4.4 g/dL (ref 3.5–5.2)
Alkaline Phosphatase: 37 U/L — ABNORMAL LOW (ref 39–117)
BILIRUBIN TOTAL: 2.7 mg/dL — AB (ref 0.2–1.2)
BUN: 10 mg/dL (ref 6–23)
CALCIUM: 9.4 mg/dL (ref 8.4–10.5)
CHLORIDE: 101 meq/L (ref 96–112)
CO2: 31 meq/L (ref 19–32)
Creatinine, Ser: 1.04 mg/dL (ref 0.40–1.50)
GFR: 77.15 mL/min (ref >60.00)
Glucose, Bld: 194 mg/dL — ABNORMAL HIGH (ref 70–99)
Potassium: 3.1 mEq/L — ABNORMAL LOW (ref 3.5–5.1)
Sodium: 141 mEq/L (ref 135–145)
Total Protein: 6.8 g/dL (ref 6.0–8.3)

## 2017-04-12 LAB — CBC
HCT: 43.3 % (ref 39.0–52.0)
HEMOGLOBIN: 15 g/dL (ref 13.0–17.0)
MCHC: 34.7 g/dL (ref 30.0–36.0)
MCV: 89.8 fl (ref 78.0–100.0)
PLATELETS: 271 10*3/uL (ref 150.0–400.0)
RBC: 4.82 Mil/uL (ref 4.22–5.81)
RDW: 14.3 % (ref 11.5–15.5)
WBC: 5.3 10*3/uL (ref 4.0–10.5)

## 2017-04-12 LAB — TSH: TSH: 7.35 u[IU]/mL — AB (ref 0.35–4.50)

## 2017-04-12 MED ORDER — SODIUM CHLORIDE 0.9 % IV SOLN: 500.0000 mL | INTRAVENOUS | Status: DC

## 2017-04-12 MED ORDER — LISINOPRIL 5 MG PO TABS: 5.0000 mg | ORAL_TABLET | Freq: Every day | ORAL | 1 refills | Status: DC

## 2017-04-12 NOTE — Progress Notes (Signed)
Retook b/p last 181/112 hr 72 sinus rhythm.  Pt denies and headacke, dizziness or any other sx.  Dr. Henrene Pastor informed and he recommended pt go to his primary care office when leaving [place].  Pt's friend said she would take him.  maw

## 2017-04-12 NOTE — Op Note (Signed)
[facility] Patient Name: Chad Avila Procedure Date: 04/12/2017 11:13 AM MRN: [id number] Endoscopist: Docia Chuck. Chad Avila , MD Age: 61 Referring MD:  Date of Birth: 08-08-1955 Gender: Male Account #: [id number] Procedure:                Colonoscopy, with cold snare polypectomy x 2 Indications:              Screening for colorectal malignant neoplasm.                            Negative index exam 2008 Medicines:                Monitored Anesthesia Care Procedure:                Pre-Anesthesia Assessment:                           - Prior to the procedure, a History and Physical                            was performed, and patient medications and                            allergies were reviewed. The patient's tolerance of                            previous anesthesia was also reviewed. The risks                            and benefits of the procedure and the sedation                            options and risks were discussed with the patient.                            All questions were answered, and informed consent                            was obtained. Prior Anticoagulants: The patient has                            taken no previous anticoagulant or antiplatelet                            agents. ASA Grade Assessment: II - A patient with                            mild systemic disease. After reviewing the risks                            and benefits, the patient was deemed in                            satisfactory condition to undergo the procedure.  After obtaining informed consent, the colonoscope                            was passed under direct vision. Throughout the                            procedure, the patient's blood pressure, pulse, and                            oxygen saturations were monitored continuously. The                            Colonoscope was introduced through the anus and                            advanced  to the the cecum, identified by                            appendiceal orifice and ileocecal valve. The                            ileocecal valve, appendiceal orifice, and rectum                            were photographed. The quality of the bowel                            preparation was excellent. The colonoscopy was                            performed without difficulty. The patient tolerated                            the procedure well. The bowel preparation used was                            SUPREP. Scope In: 11:19:50 AM Scope Out: 11:33:01 AM Scope Withdrawal Time: 0 hours 11 minutes 3 seconds  Total Procedure Duration: 0 hours 13 minutes 11 seconds  Findings:                 Two polyps were found in the ascending colon. The                            polyps were 3 to 4 mm in size. These polyps were                            removed with a cold snare. Resection and retrieval                            were complete.                           A few small-mouthed diverticula were found in the  sigmoid colon.                           Internal hemorrhoids were found during                            retroflexion. The hemorrhoids were small.                           The exam was otherwise without abnormality on                            direct and retroflexion views. Complications:            No immediate complications. Estimated blood loss:                            None. Estimated Blood Loss:     Estimated blood loss: none. Impression:               - Two 3 to 4 mm polyps in the ascending colon,                            removed with a cold snare. Resected and retrieved.                           - Diverticulosis in the sigmoid colon.                           - Internal hemorrhoids.                           - The examination was otherwise normal on direct                            and retroflexion views. Recommendation:           - Repeat  colonoscopy in 5-10 years for surveillance.                           - Patient has a contact number available for                            emergencies. The signs and symptoms of potential                            delayed complications were discussed with the                            patient. Return to normal activities tomorrow.                            Written discharge instructions were provided to the                            patient.                           -  Resume previous diet.                           - Continue present medications.                           - Await pathology results. Docia Chuck. Chad Pastor, MD 04/12/2017 11:39:30 AM This report has been signed electronically.

## 2017-04-12 NOTE — Progress Notes (Signed)
To PACU, VSS. Report to [email or web address] 

## 2017-04-12 NOTE — Patient Instructions (Signed)
Discharge instructions given. Handouts on polyps,diverticulosis and hemorrhoids. Resume previous medications. YOU HAD AN ENDOSCOPIC PROCEDURE TODAY AT THE [facility]:   Refer to the procedure report that was given to you for any specific questions about what was found during the examination.  If the procedure report does not answer your questions, please call your gastroenterologist to clarify.  If you requested that your care partner not be given the details of your procedure findings, then the procedure report has been included in a sealed envelope for you to review at your convenience later.  YOU SHOULD EXPECT: Some feelings of bloating in the abdomen. Passage of more gas than usual.  Walking can help get rid of the air that was put into your GI tract during the procedure and reduce the bloating. If you had a lower endoscopy (such as a colonoscopy or flexible sigmoidoscopy) you may notice spotting of blood in your stool or on the toilet paper. If you underwent a bowel prep for your procedure, you may not have a normal bowel movement for a few days.  Please Note:  You might notice some irritation and congestion in your nose or some drainage.  This is from the oxygen used during your procedure.  There is no need for concern and it should clear up in a day or so.  SYMPTOMS TO REPORT IMMEDIATELY:   Following lower endoscopy (colonoscopy or flexible sigmoidoscopy):  Excessive amounts of blood in the stool  Significant tenderness or worsening of abdominal pains  Swelling of the abdomen that is new, acute  Fever of 100F or higher   For urgent or emergent issues, a gastroenterologist can be reached at any hour by calling [phone].   DIET:  We do recommend a small meal at first, but then you may proceed to your regular diet.  Drink plenty of fluids but you should avoid alcoholic beverages for 24 hours.  ACTIVITY:  You should plan to take it easy for the rest of today and you  should NOT DRIVE or use heavy machinery until tomorrow (because of the sedation medicines used during the test).    FOLLOW UP: Our staff will call the number listed on your records the next business day following your procedure to check on you and address any questions or concerns that you may have regarding the information given to you following your procedure. If we do not reach you, we will leave a message.  However, if you are feeling well and you are not experiencing any problems, there is no need to return our call.  We will assume that you have returned to your regular daily activities without incident.  If any biopsies were taken you will be contacted by phone or by letter within the next 1-3 weeks.  Please call us at [phone] if you have not heard about the biopsies in 3 weeks.    SIGNATURES/CONFIDENTIALITY: You and/or your care partner have signed paperwork which will be entered into your electronic medical record.  These signatures attest to the fact that that the information above on your After Visit Summary has been reviewed and is understood.  Full responsibility of the confidentiality of this discharge information lies with you and/or your care-partner. 

## 2017-04-12 NOTE — Progress Notes (Signed)
Pt's states no medical or surgical changes since previsit or office visit. 

## 2017-04-12 NOTE — Progress Notes (Signed)
Pt b/p has been elevated through the exam.  This was brought to Dr. Blanch Media attention.  He spoke with pt and advised him to see his primary care md.  Pt's friend said they would call his primary care md today.   No other complaints noted in the recovery room.  maw

## 2017-04-12 NOTE — Progress Notes (Signed)
Subjective:    Patient ID: Chad Avila, male    DOB: 1955-10-19, 61 y.o.   MRN: [id number]  HPI This is a 61 yo male who presents today with concerns for elevated BP. He had screening colonoscopy today and was found to have elevated blood pressure throughout and after procedure. No chest pain, no SOB, no wheeze.  Blood pressures for procedure ranged- 161-181/91-114.  Saw cardiology 10/01/16 for atypical chest pain and abnormal EKG- had normal myoview 10/07/14.   Has had some recent abdominal pain, burning. Relieved with carafate. Goes from left groin up to upper left quadrant. No diarrhea or constipation. Was found to have diverticula on colonoscopy.   Past Medical History:  Diagnosis Date  . Chest pain 07/26/06   Stress myoview, NSST/TW changes, low risk  . Claustrophobia   . Degenerative joint disease    Mild, L 4/5, L 5/6 VIA X-Ray  . Epididymitis   . Gastritis   . Gilbert syndrome   . Helicobacter pylori (H. pylori)   . Hypercholesterolemia   . Hyperglycemia   . Hyperlipemia   . Hyperplastic colonic polyp   . Hypothyroidism   . Organic impotence   . Shingles    Past Surgical History:  Procedure Laterality Date  . COLONOSCOPY  03/22/2007   Benign polyps, Dr. Henrene Pastor, 10 yrs  . ETT  06/12/2000   Negative ([facility])  . FRACTURE SURGERY     Left leg, as a child  . herniorraphy  03/22/2011   Dr Bary Castilla  . HOSP     Gastritis in AF ([place], [place])  . STRESS MYOVIEW  07/26/2006   NSST/T W changes, low risk  . TONSILLECTOMY     As a child  . VENTRAL HERNIA REPAIR     As a child   Family History  Problem Relation Age of Onset  . Arthritis Mother        Osteroporosis  . Diabetes Mother   . Hypertension Mother        Mother's side, strokes  . Heart disease Mother 64       MI  12/11  . Osteoporosis Mother   . Stroke Mother   . Heart disease Father   . Heart disease Paternal Grandfather        MI  . Hypertension Other        Mother's family  . Stroke Other    Mother's side  . Colon cancer Maternal Uncle        late 38's  . Prostate cancer Neg Hx   . Stomach cancer Neg Hx   . Esophageal cancer Neg Hx    Social History  Substance Use Topics  . Smoking status: Former Smoker    Quit date: 06/27/1986  . Smokeless tobacco: Never Used  . Alcohol use 6.0 oz/week    10 Cans of beer per week     Comment: 7-10 beers per week per pt     Review of Systems .per HPI    Objective:   Physical Exam Physical Exam  Constitutional: Oriented to person, place, and time. He appears well-developed and well-nourished.  HENT:  Head: Normocephalic and atraumatic.  Eyes: Conjunctivae are normal.  Neck: Normal range of motion. Neck supple.  Cardiovascular: Normal rate, regular rhythm and normal heart sounds.   Pulmonary/Chest: Effort normal and breath sounds normal.  Musculoskeletal: Normal range of motion.  Neurological: Alert and oriented to person, place, and time.  Skin: Skin is warm and dry.  Psychiatric: Normal  mood and affect. Behavior is normal. Judgment and thought content normal.  Vitals reviewed.   BP (!) 174/100 (BP Location: Right Arm, Patient Position: Sitting, Cuff Size: Normal)   Pulse 81   Temp 98 F (36.7 C) (Oral)   Wt 163 lb 8 oz (74.2 kg)   SpO2 97%   BMI 24.86 kg/m  BP Readings from Last 3 Encounters:  04/12/17 (!) 174/100  04/12/17 (!) 176/110  11/15/16 (!) 144/86       Assessment & Plan:  1. Essential hypertension - Has had previous borderline readings, will go ahead and start medication - Has blood pressure cuff at home and will check readings, provided information for checking blood pressure, parameters, DASH diet - CBC - Comprehensive metabolic panel - TSH - lisinopril (PRINIVIL,ZESTRIL) 5 MG tablet; Take 1 tablet (5 mg total) by mouth daily.  Dispense: 30 tablet; Refill: 1 - follow up in 1 month for blood pressure recheck and labs  2. Hypothyroidism, unspecified type - TSH    Clarene Reamer,  FNP-BC  [facility]  04/13/2017 12:41 PM

## 2017-04-12 NOTE — Patient Instructions (Signed)
Take blood pressure 3-4 times a week Keep a log, if greater than 140/90 more than 50% of the time, please let us know Follow up in 1 month for blood pressure check and labs   DASH Eating Plan DASH stands for "Dietary Approaches to Stop Hypertension." The DASH eating plan is a healthy eating plan that has been shown to reduce high blood pressure (hypertension). It may also reduce your risk for type 2 diabetes, heart disease, and stroke. The DASH eating plan may also help with weight loss. What are tips for following this plan? General guidelines  Avoid eating more than 2,300 mg (milligrams) of salt (sodium) a day. If you have hypertension, you may need to reduce your sodium intake to 1,500 mg a day.  Limit alcohol intake to no more than 1 drink a day for nonpregnant women and 2 drinks a day for men. One drink equals 12 oz of beer, 5 oz of wine, or 1 oz of hard liquor.  Work with your health care provider to maintain a healthy body weight or to lose weight. Ask what an ideal weight is for you.  Get at least 30 minutes of exercise that causes your heart to beat faster (aerobic exercise) most days of the week. Activities may include walking, swimming, or biking.  Work with your health care provider or diet and nutrition specialist (dietitian) to adjust your eating plan to your individual calorie needs. Reading food labels  Check food labels for the amount of sodium per serving. Choose foods with less than 5 percent of the Daily Value of sodium. Generally, foods with less than 300 mg of sodium per serving fit into this eating plan.  To find whole grains, look for the word "whole" as the first word in the ingredient list. Shopping  Buy products labeled as "low-sodium" or "no salt added."  Buy fresh foods. Avoid canned foods and premade or frozen meals. Cooking  Avoid adding salt when cooking. Use salt-free seasonings or herbs instead of table salt or sea salt. Check with your health care  provider or pharmacist before using salt substitutes.  Do not fry foods. Cook foods using healthy methods such as baking, boiling, grilling, and broiling instead.  Cook with heart-healthy oils, such as olive, canola, soybean, or sunflower oil. Meal planning   Eat a balanced diet that includes: ? 5 or more servings of fruits and vegetables each day. At each meal, try to fill half of your plate with fruits and vegetables. ? Up to 6-8 servings of whole grains each day. ? Less than 6 oz of lean meat, poultry, or fish each day. A 3-oz serving of meat is about the same size as a deck of cards. One egg equals 1 oz. ? 2 servings of low-fat dairy each day. ? A serving of nuts, seeds, or beans 5 times each week. ? Heart-healthy fats. Healthy fats called Omega-3 fatty acids are found in foods such as flaxseeds and coldwater fish, like sardines, salmon, and mackerel.  Limit how much you eat of the following: ? Canned or prepackaged foods. ? Food that is high in trans fat, such as fried foods. ? Food that is high in saturated fat, such as fatty meat. ? Sweets, desserts, sugary drinks, and other foods with added sugar. ? Full-fat dairy products.  Do not salt foods before eating.  Try to eat at least 2 vegetarian meals each week.  Eat more home-cooked food and less restaurant, buffet, and fast food.  When  eating at a restaurant, ask that your food be prepared with less salt or no salt, if possible. What foods are recommended? The items listed may not be a complete list. Talk with your dietitian about what dietary choices are best for you. Grains Whole-grain or whole-wheat bread. Whole-grain or whole-wheat pasta. Brown rice. [place]. Bulgur. Whole-grain and low-sodium cereals. Pita bread. Low-fat, low-sodium crackers. Whole-wheat flour tortillas. Vegetables Fresh or frozen vegetables (raw, steamed, roasted, or grilled). Low-sodium or reduced-sodium tomato and vegetable juice. Low-sodium or  reduced-sodium tomato sauce and tomato paste. Low-sodium or reduced-sodium canned vegetables. Fruits All fresh, dried, or frozen fruit. Canned fruit in natural juice (without added sugar). Meat and other protein foods Skinless chicken or [place]. Ground chicken or [place]. Pork with fat trimmed off. Fish and seafood. Egg whites. Dried beans, peas, or lentils. Unsalted nuts, nut butters, and seeds. Unsalted canned beans. Lean cuts of beef with fat trimmed off. Low-sodium, lean deli meat. Dairy Low-fat (1%) or fat-free (skim) milk. Fat-free, low-fat, or reduced-fat cheeses. Nonfat, low-sodium ricotta or cottage cheese. Low-fat or nonfat yogurt. Low-fat, low-sodium cheese. Fats and oils Soft margarine without trans fats. Vegetable oil. Low-fat, reduced-fat, or light mayonnaise and salad dressings (reduced-sodium). Canola, safflower, olive, soybean, and sunflower oils. Avocado. Seasoning and other foods Herbs. Spices. Seasoning mixes without salt. Unsalted popcorn and pretzels. Fat-free sweets. What foods are not recommended? The items listed may not be a complete list. Talk with your dietitian about what dietary choices are best for you. Grains Baked goods made with fat, such as croissants, muffins, or some breads. Dry pasta or rice meal packs. Vegetables Creamed or fried vegetables. Vegetables in a cheese sauce. Regular canned vegetables (not low-sodium or reduced-sodium). Regular canned tomato sauce and paste (not low-sodium or reduced-sodium). Regular tomato and vegetable juice (not low-sodium or reduced-sodium). Angie Fava. Olives. Fruits Canned fruit in a light or heavy syrup. Fried fruit. Fruit in cream or butter sauce. Meat and other protein foods Fatty cuts of meat. Ribs. Fried meat. Berniece Salines. Sausage. Bologna and other processed lunch meats. Salami. Fatback. Hotdogs. Bratwurst. Salted nuts and seeds. Canned beans with added salt. Canned or smoked fish. Whole eggs or egg yolks. Chicken or [place]  with skin. Dairy Whole or 2% milk, cream, and half-and-half. Whole or full-fat cream cheese. Whole-fat or sweetened yogurt. Full-fat cheese. Nondairy creamers. Whipped toppings. Processed cheese and cheese spreads. Fats and oils Butter. Stick margarine. Lard. Shortening. Ghee. Bacon fat. Tropical oils, such as coconut, palm kernel, or palm oil. Seasoning and other foods Salted popcorn and pretzels. Onion salt, garlic salt, seasoned salt, table salt, and sea salt. Worcestershire sauce. Tartar sauce. Barbecue sauce. Teriyaki sauce. Soy sauce, including reduced-sodium. Steak sauce. Canned and packaged gravies. Fish sauce. Oyster sauce. Cocktail sauce. Horseradish that you find on the shelf. Ketchup. Mustard. Meat flavorings and tenderizers. Bouillon cubes. Hot sauce and Tabasco sauce. Premade or packaged marinades. Premade or packaged taco seasonings. Relishes. Regular salad dressings. Where to find more information:  National Heart, Lung, and [place]: https://[place].com/  American Heart Association: www.heart.org Summary  The DASH eating plan is a healthy eating plan that has been shown to reduce high blood pressure (hypertension). It may also reduce your risk for type 2 diabetes, heart disease, and stroke.  With the DASH eating plan, you should limit salt (sodium) intake to 2,300 mg a day. If you have hypertension, you may need to reduce your sodium intake to 1,500 mg a day.  When on the DASH eating plan, aim  to eat more fresh fruits and vegetables, whole grains, lean proteins, low-fat dairy, and heart-healthy fats.  Work with your health care provider or diet and nutrition specialist (dietitian) to adjust your eating plan to your individual calorie needs. This information is not intended to replace advice given to you by your health care provider. Make sure you discuss any questions you have with your health care provider. Document Released: 06/02/2011 Document Revised: 06/06/2016  Document Reviewed: 06/06/2016 Elsevier Interactive Patient Education  2017 [place].   How to Take Your Blood Pressure Blood pressure is a measurement of how strongly your blood is pressing against the walls of your arteries. Arteries are blood vessels that carry blood from your heart throughout your body. Your health care provider takes your blood pressure at each office visit. You can also take your own blood pressure at home with a blood pressure machine. You may need to take your own blood pressure:  To confirm a diagnosis of high blood pressure (hypertension).  To monitor your blood pressure over time.  To make sure your blood pressure medicine is working.  Supplies needed: To take your blood pressure, you will need a blood pressure machine. You can buy a blood pressure machine, or blood pressure monitor, at most drugstores or online. There are several types of home blood pressure monitors. When choosing one, consider the following:  Choose a monitor that has an arm cuff.  Choose a monitor that wraps snugly around your upper arm. You should be able to fit only one finger between your arm and the cuff.  Do not choose a monitor that measures your blood pressure from your wrist or finger.  Your health care provider can suggest a reliable monitor that will meet your needs. How to prepare To get the most accurate reading, avoid the following for 30 minutes before you check your blood pressure:  Drinking caffeine.  Drinking alcohol.  Eating.  Smoking.  Exercising.  Five minutes before you check your blood pressure:  Empty your bladder.  Sit quietly without talking in a dining chair, rather than in a soft couch or armchair.  How to take your blood pressure To check your blood pressure, follow the instructions in the manual that came with your blood pressure monitor. If you have a digital blood pressure monitor, the instructions may be as follows: 1. Sit up  straight. 2. Place your feet on the floor. Do not cross your ankles or legs. 3. Rest your left arm at the level of your heart on a table or desk or on the arm of a chair. 4. Pull up your shirt sleeve. 5. Wrap the blood pressure cuff around the upper part of your left arm, 1 inch (2.5 cm) above your elbow. It is best to wrap the cuff around bare skin. 6. Fit the cuff snugly around your arm. You should be able to place only one finger between the cuff and your arm. 7. Position the cord inside the groove of your elbow. 8. Press the power button. 9. Sit quietly while the cuff inflates and deflates. 10. Read the digital reading on the monitor screen and write it down (record it). 11. Wait 2-3 minutes, then repeat the steps, starting at step 1.  What does my blood pressure reading mean? A blood pressure reading consists of a higher number over a lower number. Ideally, your blood pressure should be below 120/80. The first ("top") number is called the systolic pressure. It is a measure of the  pressure in your arteries as your heart beats. The second ("bottom") number is called the diastolic pressure. It is a measure of the pressure in your arteries as the heart relaxes. Blood pressure is classified into four stages. The following are the stages for adults who do not have a short-term serious illness or a chronic condition. Systolic pressure and diastolic pressure are measured in a unit called mm Hg. Normal  Systolic pressure: below 450.  Diastolic pressure: below 80. Elevated  Systolic pressure: 388-828.  Diastolic pressure: below 80. Hypertension stage 1  Systolic pressure: 003-491.  Diastolic pressure: 79-15. Hypertension stage 2  Systolic pressure: 056 or above.  Diastolic pressure: 90 or above. You can have prehypertension or hypertension even if only the systolic or only the diastolic number in your reading is higher than normal. Follow these instructions at home:  Check your blood  pressure as often as recommended by your health care provider.  Take your monitor to the next appointment with your health care provider to make sure: ? That you are using it correctly. ? That it provides accurate readings.  Be sure you understand what your goal blood pressure numbers are.  Tell your health care provider if you are having any side effects from blood pressure medicine. Contact a health care provider if:  Your blood pressure is consistently high. Get help right away if:  Your systolic blood pressure is higher than 180.  Your diastolic blood pressure is higher than 110. This information is not intended to replace advice given to you by your health care provider. Make sure you discuss any questions you have with your health care provider. Document Released: 11/20/2015 Document Revised: 02/02/2016 Document Reviewed: 11/20/2015 Elsevier Interactive Patient Education  [employer].

## 2017-04-12 NOTE — Progress Notes (Signed)
Called to room to assist during endoscopic procedure.  Patient ID and intended procedure confirmed with present staff. Received instructions for my participation in the procedure from the performing physician.  

## 2017-04-13 ENCOUNTER — Telehealth: Payer: Self-pay | Admitting: *Deleted

## 2017-04-13 ENCOUNTER — Telehealth: Payer: Self-pay

## 2017-04-13 LAB — HEMOGLOBIN A1C: Hgb A1c MFr Bld: 5.6 % (ref 4.6–6.5)

## 2017-04-13 NOTE — Telephone Encounter (Signed)
Called [phone] and left a messaged we tried to reach pt for a follow up call. maw

## 2017-04-13 NOTE — Telephone Encounter (Signed)
No answer for second post procedure call back. LEft message for the patient to call with any questions or concerns. SM

## 2017-04-16 ENCOUNTER — Other Ambulatory Visit: Payer: Self-pay | Admitting: Family Medicine

## 2017-04-16 ENCOUNTER — Emergency Department
Admission: EM | Admit: 2017-04-16 | Discharge: 2017-04-16 | Disposition: A | Discharge status: Home / Self Care | Payer: Managed Care, Other (non HMO) | Attending: Emergency Medicine | Admitting: Emergency Medicine

## 2017-04-16 DIAGNOSIS — R55 Syncope and collapse: Secondary | ICD-10-CM

## 2017-04-16 DIAGNOSIS — I10 Essential (primary) hypertension: Secondary | ICD-10-CM | POA: Diagnosis present

## 2017-04-16 DIAGNOSIS — Z79899 Other long term (current) drug therapy: Secondary | ICD-10-CM | POA: Insufficient documentation

## 2017-04-16 DIAGNOSIS — Z87891 Personal history of nicotine dependence: Secondary | ICD-10-CM | POA: Diagnosis not present

## 2017-04-16 DIAGNOSIS — E039 Hypothyroidism, unspecified: Secondary | ICD-10-CM | POA: Insufficient documentation

## 2017-04-16 HISTORY — DX: Essential (primary) hypertension: I10

## 2017-04-16 LAB — CBC WITH DIFFERENTIAL/PLATELET
BASOS PCT: 1 %
Basophils Absolute: 0 10*3/uL (ref 0–0.1)
Eosinophils Absolute: 0.2 10*3/uL (ref 0–0.7)
Eosinophils Relative: 6 %
HEMATOCRIT: 43.9 % (ref 40.0–52.0)
HEMOGLOBIN: 15.1 g/dL (ref 13.0–18.0)
Lymphocytes Relative: 25 %
Lymphs Abs: 1 10*3/uL (ref 1.0–3.6)
MCH: 30.7 pg (ref 26.0–34.0)
MCHC: 34.3 g/dL (ref 32.0–36.0)
MCV: 89.4 fL (ref 80.0–100.0)
MONO ABS: 0.3 10*3/uL (ref 0.2–1.0)
Monocytes Relative: 7 %
NEUTROS ABS: 2.6 10*3/uL (ref 1.4–6.5)
NEUTROS PCT: 61 %
Platelets: 240 10*3/uL (ref 150–440)
RBC: 4.91 MIL/uL (ref 4.40–5.90)
RDW: 14.7 % — AB (ref 11.5–14.5)
WBC: 4.3 10*3/uL (ref 3.8–10.6)

## 2017-04-16 LAB — COMPREHENSIVE METABOLIC PANEL
ALK PHOS: 43 U/L (ref 38–126)
ALT: 20 U/L (ref 17–63)
ANION GAP: 11 (ref 5–15)
AST: 21 U/L (ref 15–41)
Albumin: 4.4 g/dL (ref 3.5–5.0)
BILIRUBIN TOTAL: 2.1 mg/dL — AB (ref 0.3–1.2)
BUN: 11 mg/dL (ref 6–20)
CALCIUM: 9.3 mg/dL (ref 8.9–10.3)
CO2: 28 mmol/L (ref 22–32)
CREATININE: 0.86 mg/dL (ref 0.61–1.24)
Chloride: 102 mmol/L (ref 101–111)
Glucose, Bld: 122 mg/dL — ABNORMAL HIGH (ref 65–99)
Potassium: 3.3 mmol/L — ABNORMAL LOW (ref 3.5–5.1)
SODIUM: 141 mmol/L (ref 135–145)
TOTAL PROTEIN: 7.6 g/dL (ref 6.5–8.1)

## 2017-04-16 LAB — TROPONIN I: Troponin I: <0.03 ng/mL (ref <0.03)

## 2017-04-16 MED ORDER — LISINOPRIL 5 MG PO TABS
5.0000 mg | ORAL_TABLET | Freq: Once | ORAL | Status: AC
  Administered 2017-04-16: 5 mg via ORAL
  Filled 2017-04-16: qty 1

## 2017-04-16 NOTE — ED Provider Notes (Signed)
[facility] Emergency Department Provider Note ____________________________________________   First MD Initiated Contact with Patient 04/16/17 [phone]     (approximate)  I have reviewed the triage vital signs and the nursing notes.   HISTORY  Chief Complaint Hypertension    HPI Chad Avila is a 60 y.o. male with past medical history as below including recent diagnosis of hypertension, who presents with hypertension, acute onset this morning, measured to 250s over 150s, and associated with near syncope. Patient states that he woke and was up for about an hour, when he started to feel lightheaded, diaphoretic, and "clammy," and felt like he might pass out. He checked his blood pressure at that time and it was elevated. Patient denies any associated chest pain, difficulty breathing, palpitations, or headache.  Patient states that he had a colonoscopy 4 days ago, and was first noted to be hypertensive at that time, and subsequently started on lisinopril by his doctor. He did not take his dose yet today.   Past Medical History:  Diagnosis Date  . Chest pain 07/26/06   Stress myoview, NSST/TW changes, low risk  . Claustrophobia   . Degenerative joint disease    Mild, L 4/5, L 5/6 VIA X-Ray  . Epididymitis   . Gastritis   . Gilbert syndrome   . Helicobacter pylori (H. pylori)   . Hypercholesterolemia   . Hyperglycemia   . Hyperlipemia   . Hyperplastic colonic polyp   . Hypertension    induced post colonoscopy  . Hypothyroidism   . Organic impotence   . Shingles     Patient Active Problem List   Diagnosis Date Noted  . Abdominal bloating 04/07/2016  . Abdominal wall pain 09/06/2015  . Routine general medical examination at a health care facility 10/02/2011  . Nocturia 08/26/2011  . Elevated BP 09/23/2010  . CLAUSTROPHOBIA 09/17/2007  . Hypothyroidism 01/16/2007  . 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 HYPERGLYCEMIA 01/15/2007    Past  Surgical History:  Procedure Laterality Date  . COLONOSCOPY  03/22/2007   Benign polyps, Dr. Henrene Pastor, 10 yrs  . ETT  06/12/2000   Negative ([facility])  . FRACTURE SURGERY     Left leg, as a child  . herniorraphy  03/22/2011   Dr Bary Castilla  . HOSP     Gastritis in AF ([place], [place])  . STRESS MYOVIEW  07/26/2006   NSST/T W changes, low risk  . TONSILLECTOMY     As a child  . VENTRAL HERNIA REPAIR     As a child    Prior to Admission medications   Medication Sig Start Date End Date Taking? Authorizing Provider  fluticasone (FLONASE) 50 MCG/ACT nasal spray USE 2 SPRAYS INTO EACH NOSTRIL ONCE A DAY AS NEEDED 11/15/16   Tonia Ghent, MD  levothyroxine (SYNTHROID, LEVOTHROID) 200 MCG tablet TAKE 1 TABLET BY MOUTH DAILY BEFORE BREAKFAST 11/15/16   Tonia Ghent, MD  lisinopril (PRINIVIL,ZESTRIL) 5 MG tablet Take 1 tablet (5 mg total) by mouth daily. 04/12/17   Elby Beck, FNP  Multiple Vitamin (MULTIVITAMIN) tablet Take 1 tablet by mouth daily.      [provider]  sildenafil (REVATIO) 20 MG tablet Take 3-5 tablets (60-100 mg total) by mouth daily as needed. 11/15/16   Tonia Ghent, MD  sucralfate (CARAFATE) 1 g tablet Take 1 tablet (1 g total) by mouth 4 (four) times daily -  with meals and at bedtime. 04/07/17   Tonia Ghent, MD  Allergies Patient has no known allergies.  Family History  Problem Relation Age of Onset  . Arthritis Mother        Osteroporosis  . Diabetes Mother   . Hypertension Mother        Mother's side, strokes  . Heart disease Mother 63       MI  12/11  . Osteoporosis Mother   . Stroke Mother   . Heart disease Father   . Heart disease Paternal Grandfather        MI  . Hypertension Other        Mother's family  . Stroke Other        Mother's side  . Colon cancer Maternal Uncle        late 26's  . Prostate cancer Neg Hx   . Stomach cancer Neg Hx   . Esophageal cancer Neg Hx     Social History Social History  Substance Use  Topics  . Smoking status: Former Smoker    Quit date: 06/27/1986  . Smokeless tobacco: Never Used  . Alcohol use 6.0 oz/week    10 Cans of beer per week     Comment: 7-10 beers per week per pt    Review of Systems  Constitutional: No fever. Eyes: No visual changes. ENT: No neck pain.  Cardiovascular: Denies chest pain. Respiratory: Denies shortness of breath. Gastrointestinal: No nausea, no vomiting.    Genitourinary: Negative for dysuria.  Musculoskeletal: Negative for back pain. Skin: Negative for rash. Neurological: Negative for headache.   ____________________________________________   PHYSICAL EXAM:  VITAL SIGNS: ED Triage Vitals  Enc Vitals Group     BP 04/16/17 0731 (!) 163/104     Pulse Rate 04/16/17 0731 77     Resp 04/16/17 0731 20     Temp 04/16/17 0731 98.4 F (36.9 C)     Temp Source 04/16/17 0731 Oral     SpO2 04/16/17 0731 100 %     Weight 04/16/17 0730 165 lb (74.8 kg)     Height 04/16/17 0730 5\' 8"  (1.727 m)     Head Circumference --      Peak Flow --      Pain Score 04/16/17 0742 2     Pain Loc --      Pain Edu? --      Excl. in [place]? --     Constitutional: Alert and oriented. Well appearing and in no acute distress. Eyes: Conjunctivae are normal. EOMI.  PERRLA.  Head: Atraumatic. Nose: No congestion/rhinnorhea. Mouth/Throat: Mucous membranes are moist.   Neck: Normal range of motion.  Cardiovascular: Normal rate, regular rhythm. Grossly normal heart sounds.  Good peripheral circulation. Respiratory: Normal respiratory effort.  No retractions. Lungs CTAB. Gastrointestinal: Soft and nontender. No distention.  Genitourinary: No CVA tenderness. Musculoskeletal: No lower extremity edema.  Extremities warm and well perfused.  Neurologic:  Normal speech and language. No gross focal neurologic deficits are appreciated. Motor and sensory intact in all extremities.  Normal coordination.   Skin:  Skin is warm and dry. No rash noted. Psychiatric: Mood  and affect are normal. Speech and behavior are normal.  ____________________________________________   LABS (all labs ordered are listed, but only abnormal results are displayed)  Labs Reviewed  CBC WITH DIFFERENTIAL/PLATELET - Abnormal; Notable for the following:       Result Value   RDW 14.7 (*)    All other components within normal limits  COMPREHENSIVE METABOLIC PANEL - Abnormal; Notable for the following:  Potassium 3.3 (*)    Glucose, Bld 122 (*)    Total Bilirubin 2.1 (*)    All other components within normal limits  TROPONIN I   ____________________________________________  EKG  ED ECG REPORT I, Arta Silence, the attending physician, personally viewed and interpreted this ECG.  Date: 04/16/2017 EKG Time: 742 Rate: 74 Rhythm: normal sinus rhythm QRS Axis: normal Intervals: normal ST/T Wave abnormalities: unspecific repolarization abnormality, lateral leads Narrative Interpretation: no evidence of acute ischemia; no significant changes when compared to EKG of 10/02/1914  ____________________________________________  RADIOLOGY    ____________________________________________   PROCEDURES  Procedure(s) performed: No    Critical Care performed: No ____________________________________________   INITIAL IMPRESSION / ASSESSMENT AND PLAN / ED COURSE  Pertinent labs & imaging results that were available during my care of the patient were reviewed by me and considered in my medical decision making (see chart for details).  61 year old male with past medical history as noted presents with hypertension, noted this morning to be 250s over 150s after an episode of near syncope with a prodrome of lightheadedness. Patient's blood pressure slightly improved at the time EMS came to him, and now is significantly improved.  review of past medical records in Epic is significant for colonoscopy in the past week, at which time patient was noted to be hypertensive  and referred to his PMD, who started him on lisinopril. Otherwise noncontributory.  On exam, patient is well-appearing, other vital signs are normal, and exam is unremarkable. Presentation is consistent with essential hypertension; patient only recently started on new medication, and did not take it this morning. Patient's other blood pressure readings during the last several days have been well-controlled. Low suspicion for end-organ damage or other hypertensive emergency. Plan: basic labs, troponin 1, and will observe BP in the ED. If it remains stable, likely discharge home with recommendation to continue his medication and follow-up with primary care.    ----------------------------------------- 9:16 AM on 04/16/2017 -----------------------------------------  Patient's blood pressure remains stable, most recently 160s over 90s. He remains asymptomatic.  Lab workup is unremarkable.  Patient given instructions to keep a diary of his blood pressures, and call his primary care doctor tomorrow for further recommendations. Return precautions given. Patient feels well to go home, and is comfortable with the plan.  ____________________________________________   FINAL CLINICAL IMPRESSION(S) / ED DIAGNOSES  Final diagnoses:  Hypertension, unspecified type  Near syncope      NEW MEDICATIONS STARTED DURING THIS VISIT:  New Prescriptions   No medications on file     Note:  This document was prepared using Dragon voice recognition software and may include unintentional dictation errors.    Arta Silence, MD 04/16/17 [phone]

## 2017-04-16 NOTE — ED Notes (Signed)
Pt verbalizes understanding of discharge instruction

## 2017-04-16 NOTE — ED Notes (Signed)
MD at bedside. 

## 2017-04-16 NOTE — Discharge Instructions (Signed)
Return to the ER for new or worsening symptoms including blood pressure higher than 200/120, severe headache, chest pain, difficulty breathing, or any other new or worsening symptoms that concern you. Call your doctor tomorrow to discuss follow-up and whether any medication changes are recommended.

## 2017-04-16 NOTE — ED Notes (Signed)
Connected patient to monitor obtained vitals and EKG done given to Md

## 2017-04-16 NOTE — ED Triage Notes (Signed)
Pt to ED from home c/o hypertension. Per EMS pt had a colonoscopy on Wednesday in which hypertension was noted prior to procedure; pt was prescribed lisinopril 5mg  po qd. EMS then reported this morning pt was standing and had a sudden onset of dizziness, light headedness, and diaphoresis. Pt reports felling like he was going to "pass out"; and checked his BP with automated cuff 256/156; at the scene EMS reports BP 192/120. Pt denies hx of hypertension, states he "may have been unaware". He is alert and oriented at this time in no acute distress.

## 2017-04-17 ENCOUNTER — Telehealth: Payer: Self-pay

## 2017-04-17 NOTE — Telephone Encounter (Signed)
Patient Name: Chad Avila Gender: Male DOB: 21-Jul-1955 Age: 61 Y 1 M 3 D Return Phone Number: [phone] (Primary) Address: City/State/Zip: [place] [address] Client [facility] - Client Client Site [facility] - Day Physician Renford Dills - MD Contact Type Call Who Is Calling Patient / Member / Family / Caregiver Call Type Triage / Clinical Relationship To Patient Self Return Phone Number [phone] (Primary) Chief Complaint Abdominal Pain Reason for Call Symptomatic / Request for Health Information Initial Comment BP 186/104, kidney, stomach pains, just placed on new med. Appointment Disposition EMR Appointment Not Necessary Info pasted into Epic No Translation No Nurse Assessment Nurse: Cherie Dark, RN, Jarrett Soho Date/Time (Eastern Time): 04/15/2017 8:18:21 AM Confirm and document reason for call. If symptomatic, describe symptoms. ---Caller states he has been having some kidney and stomach pain for a few weeks now off and now. Had a colonoscopy on Wednesday and his BP was high that day. Was started on Lisinopril on Wednesday and his current BP is 186/104. Does the patient have any new or worsening symptoms? ---Yes Will a triage be completed? ---Yes Related visit to physician within the last 2 weeks? ---Yes Does the PT have any chronic conditions? (i.e. diabetes, asthma, etc.) ---Yes List chronic conditions. ---HTN, Is this a behavioral health or substance abuse call? ---No Guidelines Guideline Title Affirmed Question Affirmed Notes Nurse Date/Time (Eastern Time) Abdominal Pain - Male [1] MILD-MODERATE pain AND [2] constant AND [3] present > 2 hours Cranmore, RN, Hannah 04/15/2017 8:21:02 AM High Blood Pressure Systolic BP >= 782 OR Diastolic >= 423 Cranmore, RN, Hannah 04/15/2017 8:22:54 AM Disp. Time Eilene Ghazi Time) Disposition Final User 04/15/2017 8:22:39 AM See Physician within 4 Hours (or PCP triage) Cherie Dark, RN,  Jarrett Soho PLEASE NOTE: All timestamps contained within this report are represented as [place] Standard Time. CONFIDENTIALTY NOTICE: This fax transmission is intended only for the addressee. It contains information that is legally privileged, confidential or otherwise protected from use or disclosure. If you are not the intended recipient, you are strictly prohibited from reviewing, disclosing, copying using or disseminating any of this information or taking any action in reliance on or regarding this information. If you have received this fax in error, please notify us immediately by telephone so that we can arrange for its return to [place]. Phone: [phone], Toll-Free: [phone], Fax: [phone] Page: 2 of 2 Call Id: [id number] 04/15/2017 8:26:11 AM See Physician within 24 Hours Yes Cherie Dark, RN, Hayes Ludwig Disagree/Comply Comply Caller Understands Yes PreDisposition Call Doctor Care Advice Given Per Guideline CARE ADVICE given per Abdominal Pain, Male (Adult) guideline. NOTHING BY MOUTH: Do not eat or drink anything for now. REST: Lie down and rest until seen. CALL BACK IF: * You become worse. SEE PHYSICIAN WITHIN 4 HOURS (or PCP triage): * IF OFFICE WILL BE CLOSED AND NO PCP TRIAGE: You need to be seen within the next 3 or 4 hours. A nearby Urgent Care Center is often a good source of care. Another choice is to go to the ER. Go sooner if you become worse. SEE PHYSICIAN WITHIN 24 HOURS: * IF OFFICE WILL BE CLOSED AND NO PCP TRIAGE: You need to be seen within the next 24 hours. An urgent care center is often a good source of care if your doctor's office is closed. CALL BACK IF: * Weakness or numbness of the face, arm or leg on one side of the body occurs * Difficulty walking, difficulty talking, or severe headache occurs *  Chest pain or difficulty breathing occurs * You become worse. CARE ADVICE given per High Blood Pressure (Adult) guideline. Referrals [facility]

## 2017-04-18 ENCOUNTER — Encounter: Payer: Self-pay | Admitting: Family Medicine

## 2017-04-18 ENCOUNTER — Encounter: Payer: Self-pay | Admitting: Internal Medicine

## 2017-04-18 ENCOUNTER — Ambulatory Visit (INDEPENDENT_AMBULATORY_CARE_PROVIDER_SITE_OTHER): Payer: Managed Care, Other (non HMO) | Admitting: Family Medicine

## 2017-04-18 DIAGNOSIS — R1032 Left lower quadrant pain: Secondary | ICD-10-CM | POA: Diagnosis not present

## 2017-04-18 DIAGNOSIS — E039 Hypothyroidism, unspecified: Secondary | ICD-10-CM

## 2017-04-18 DIAGNOSIS — I10 Essential (primary) hypertension: Secondary | ICD-10-CM | POA: Diagnosis not present

## 2017-04-18 MED ORDER — LISINOPRIL 5 MG PO TABS: 10.0000 mg | ORAL_TABLET | Freq: Every day | ORAL | 12 refills | Status: DC

## 2017-04-18 NOTE — Patient Instructions (Addendum)
Gradually increase lisinopril dose.  Take 10mg  a day for about 1 week, then 15mg  if BP is still >140/>90. If lightheaded or BP is low, then cut back on the dose and update me.  Recheck BMET in about 2 weeks- lab visit.  Heart.[email or web address] has ideas about low salt foods when eating out.  Take tylenol for the left lower quadrant pain. We can get you back over to the surgery clinic if needed.    Different lab visit: recheck his TSH in about 2 months.    Take care.  Glad to see you.

## 2017-04-18 NOTE — Progress Notes (Signed)
He had no sx of HTN but BP was elevated prior to colonoscopy.  He wasn't having CP or SOB prior to the procedure.  BP was up on check prior to procedure, at the endoscopy site.  His BP didn't go down during the procedure.  His BP was improved by the time he got here for check with Carlean Purl.  He had worsening BPs over the weekend, went to work.  He rechecked his BP on a different machine at work, was still elevated.  He had eval over the weekend.  Was advised to come in this week.   Sunday AM he felt like he was going to pass out.  He waited a few minutes and rechecked BP which was up significantly.  He went to ER.  Eval done at ER, notes reviewed.    Discharged home.    in the meantime, his BP has "levelled off" ~891 systolic.    He still has some L lower quadrant pain, at site of prev surgery.  Persistent pain, consistent, not sore to the touch, but ~2/10, not a severe pain.  This is longstanding but more recently persistent.  Ibuprofen used to help that pain but he hasn't taken nsaids recently.    He has a different sensation in the skin on the upper abd when his BP is high and only when his BP is high.    Colonoscopy report- Two 3 to 4 mm polyps in the ascending colon, removed with a cold snare. Resected and retrieved. - Diverticulosis in the sigmoid colon. - Internal hemorrhoids. - The examination was otherwise normal on direct and retroflexion views.  Still able to work.  No CP, SOB, BLE edema.  No ADE on lisinopril.    TSH d/w pt.  He had missed a few doses with the colonoscopy.    Meds, vitals, and allergies reviewed.   ROS: Per HPI unless specifically indicated in ROS section   GEN: nad, alert and oriented HEENT: mucous membranes moist NECK: supple w/o LA CV: rrr.  no murmur PULM: ctab, no inc wob ABD: soft, +bs. LLQ mildly ttp at prev surgery site.   EXT: no edema SKIN: no acute rash

## 2017-04-20 DIAGNOSIS — R1032 Left lower quadrant pain: Secondary | ICD-10-CM | POA: Insufficient documentation

## 2017-04-20 NOTE — Assessment & Plan Note (Signed)
Not acute, has been noted since prior surgery. Take tylenol for the left lower quadrant pain.We can get him back over to the surgery clinic if needed, but I don't feel a mass or suspect any acute issue right now. He agrees.  Likely unrelated to the other issues addressed today.

## 2017-04-20 NOTE — Assessment & Plan Note (Addendum)
Gradually increase lisinopril dose.  Take 10mg  a day for about 1 week, then 15mg  if BP is still >140/>90. If lightheaded or BP is low, then cut back on the dose and update me.  Recheck BMET in about 2 weeks- lab visit.  [email or web address] has ideas about low salt foods when eating out.  >25 minutes spent in face to face time with patient, >50% spent in counselling or coordination of care.

## 2017-04-20 NOTE — Assessment & Plan Note (Signed)
recheck his TSH in about 2 months.   Wouldn't change dose at this point.  D/w pt.  He agrees.

## 2017-04-28 ENCOUNTER — Encounter: Payer: Self-pay | Admitting: Family Medicine

## 2017-04-30 ENCOUNTER — Other Ambulatory Visit: Payer: Self-pay | Admitting: Family Medicine

## 2017-04-30 DIAGNOSIS — I10 Essential (primary) hypertension: Secondary | ICD-10-CM

## 2017-04-30 DIAGNOSIS — G4730 Sleep apnea, unspecified: Secondary | ICD-10-CM

## 2017-04-30 MED ORDER — LISINOPRIL 20 MG PO TABS: 20.0000 mg | ORAL_TABLET | Freq: Every day | ORAL | 3 refills | Status: DC

## 2017-05-02 ENCOUNTER — Other Ambulatory Visit (INDEPENDENT_AMBULATORY_CARE_PROVIDER_SITE_OTHER): Payer: Managed Care, Other (non HMO)

## 2017-05-02 DIAGNOSIS — I10 Essential (primary) hypertension: Secondary | ICD-10-CM

## 2017-05-03 LAB — BASIC METABOLIC PANEL
BUN: 11 mg/dL (ref 6–23)
CHLORIDE: 104 meq/L (ref 96–112)
CO2: 30 mEq/L (ref 19–32)
CREATININE: 0.96 mg/dL (ref 0.40–1.50)
Calcium: 9.5 mg/dL (ref 8.4–10.5)
GFR: 84.6 mL/min (ref >60.00)
GLUCOSE: 97 mg/dL (ref 70–99)
POTASSIUM: 3.5 meq/L (ref 3.5–5.1)
Sodium: 142 mEq/L (ref 135–145)

## 2017-05-19 ENCOUNTER — Encounter: Payer: Self-pay | Admitting: Family Medicine

## 2017-06-13 ENCOUNTER — Encounter: Payer: Self-pay | Admitting: Family Medicine

## 2017-06-13 ENCOUNTER — Ambulatory Visit: Payer: Managed Care, Other (non HMO) | Admitting: Family Medicine

## 2017-06-13 DIAGNOSIS — E039 Hypothyroidism, unspecified: Secondary | ICD-10-CM | POA: Diagnosis not present

## 2017-06-13 DIAGNOSIS — I10 Essential (primary) hypertension: Secondary | ICD-10-CM | POA: Diagnosis not present

## 2017-06-13 LAB — BASIC METABOLIC PANEL
BUN: 15 mg/dL (ref 6–23)
CALCIUM: 9.1 mg/dL (ref 8.4–10.5)
CHLORIDE: 101 meq/L (ref 96–112)
CO2: 30 mEq/L (ref 19–32)
Creatinine, Ser: 0.84 mg/dL (ref 0.40–1.50)
GFR: 98.65 mL/min (ref >60.00)
Glucose, Bld: 101 mg/dL — ABNORMAL HIGH (ref 70–99)
POTASSIUM: 3.5 meq/L (ref 3.5–5.1)
Sodium: 140 mEq/L (ref 135–145)

## 2017-06-13 LAB — TSH: TSH: 0.01 u[IU]/mL — ABNORMAL LOW (ref 0.35–4.50)

## 2017-06-13 MED ORDER — LISINOPRIL 20 MG PO TABS: 40.0000 mg | ORAL_TABLET | Freq: Every day | ORAL | Status: DC

## 2017-06-13 NOTE — Patient Instructions (Addendum)
Go to the lab on the way out.  We'll contact you with your lab report. Take care.  Glad to see you.  Don't change your meds for now.  Update me about your BP after the sleep study.

## 2017-06-13 NOTE — Assessment & Plan Note (Signed)
Reasonable for now, recheck bmet, no change in meds, await BP readings after sleep study and potential tx for OSA.  Rationale d/w pt, he agrees.  bp isn't at goal at [place] but is okay for the short term and I don't want to induce hypotension.

## 2017-06-13 NOTE — Addendum Note (Signed)
Addended by: Pilar Grammes on: 06/13/2017 08:42 AM   Modules accepted: Orders

## 2017-06-13 NOTE — Progress Notes (Signed)
Hypertension:    Using medication without problems or lightheadedness: yes Chest pain with exertion:no Edema:no Short of breath:no Average home BPs: see below.  Other issues: he has sleep study pending.   He has higher BP in the AM right after waking, lower later in the day.  Later in the day his BP gets down to 830N systolic, lower 40H diastolic.  Labs d/w pt.   Due for repeat BMET.  No diastolic readings >680 now.    Flu shot to be done at work.    Hypothyroidism, due for repeat TSH.    Meds, vitals, and allergies reviewed.   PMH and SH reviewed  ROS: Per HPI unless specifically indicated in ROS section   GEN: nad, alert and oriented HEENT: mucous membranes moist NECK: supple w/o LA, no tmg CV: rrr. PULM: ctab, no inc wob ABD: soft, +bs EXT: no edema

## 2017-06-13 NOTE — Assessment & Plan Note (Signed)
Recheck tsh today, see notes on labs.

## 2017-06-16 ENCOUNTER — Other Ambulatory Visit: Payer: Self-pay | Admitting: Family Medicine

## 2017-06-16 ENCOUNTER — Other Ambulatory Visit: Payer: Managed Care, Other (non HMO)

## 2017-06-16 DIAGNOSIS — E039 Hypothyroidism, unspecified: Secondary | ICD-10-CM

## 2017-06-16 MED ORDER — LEVOTHYROXINE SODIUM 200 MCG PO TABS: ORAL_TABLET | ORAL | Status: DC

## 2017-08-16 ENCOUNTER — Other Ambulatory Visit (INDEPENDENT_AMBULATORY_CARE_PROVIDER_SITE_OTHER): Payer: Managed Care, Other (non HMO)

## 2017-08-16 DIAGNOSIS — E039 Hypothyroidism, unspecified: Secondary | ICD-10-CM | POA: Diagnosis not present

## 2017-08-17 ENCOUNTER — Other Ambulatory Visit: Payer: Managed Care, Other (non HMO)

## 2017-08-17 LAB — TSH: TSH: 3.81 u[IU]/mL (ref 0.35–4.50)

## 2017-09-20 ENCOUNTER — Encounter: Payer: Self-pay | Admitting: Family Medicine

## 2017-09-20 ENCOUNTER — Ambulatory Visit: Payer: Managed Care, Other (non HMO) | Admitting: Family Medicine

## 2017-09-20 VITALS — BP 158/80 | HR 68 | Temp 98.8°F | Wt 155.2 lb

## 2017-09-20 DIAGNOSIS — I10 Essential (primary) hypertension: Secondary | ICD-10-CM | POA: Diagnosis not present

## 2017-09-20 DIAGNOSIS — M546 Pain in thoracic spine: Secondary | ICD-10-CM

## 2017-09-20 MED ORDER — CYCLOBENZAPRINE HCL 10 MG PO TABS: 5.0000 mg | ORAL_TABLET | Freq: Every day | ORAL | 0 refills | Status: DC | PRN

## 2017-09-20 MED ORDER — HYDROCHLOROTHIAZIDE 12.5 MG PO TABS: 12.5000 mg | ORAL_TABLET | Freq: Every day | ORAL | 3 refills | Status: DC

## 2017-09-20 NOTE — Patient Instructions (Signed)
Try adding HCTZ onto lisinopril.  Try tylenol with flexeril for the pain and update me as needed.  Take care.  Glad to see you.

## 2017-09-20 NOTE — Progress Notes (Signed)
HTN.  BP similar to today on mult checks.  Taking 40mg  lisinopril a day.  Using CPAP for about 1 month.  He feel better with CPAP but it isn't lowering his pressure.    Back pain.  Center of back for about 3-4 weeks, just to R and L of midline.  More pain with twisting.  Less pain in neutral position.  No trigger or trauma.  Scant rash on the R chest wall but not dermatomal.  The pain is better now, less severe then prev, but not resolved.  Aleve helped a little.  Tylenol helped some.  It is worse in the AM, prior to stretching.  Hot shower helps.    Meds, vitals, and allergies reviewed.   ROS: Per HPI unless specifically indicated in ROS section   GEN: nad, alert and oriented HEENT: mucous membranes moist NECK: supple w/o LA CV: rrr.  PULM: ctab, no inc wob ABD: soft, +bs EXT: no edema SKIN: A few scant areas of mild irritation in non-dermatomal distribution on the right side of the chest.  No change in skin sensation in dermatomal distribution that would be consistent with shingles. Midline back not tender to palpation.  No rash there.  No bruise. No CVA pain.

## 2017-09-20 NOTE — Assessment & Plan Note (Signed)
I think this is likely a muscle strain that is been slow to heal.  Discussed with patient about options.  Relative rest.  Heat, Flexeril, Tylenol are reasonable to use.  Sedation caution of Flexeril.  Update me as needed.  He agrees.

## 2017-09-20 NOTE — Assessment & Plan Note (Signed)
Not controlled.  Add on hydrochlorothiazide 12.5 mg a day.  Continue 40 mg of lisinopril a day.  Routine cautions given.  Update me as needed, if BP isn't controlled.  He agrees.

## 2017-09-29 ENCOUNTER — Encounter: Payer: Self-pay | Admitting: Family Medicine

## 2017-10-17 ENCOUNTER — Ambulatory Visit: Payer: Managed Care, Other (non HMO) | Admitting: Family Medicine

## 2017-10-17 ENCOUNTER — Encounter: Payer: Self-pay | Admitting: Family Medicine

## 2017-10-17 ENCOUNTER — Ambulatory Visit (INDEPENDENT_AMBULATORY_CARE_PROVIDER_SITE_OTHER)
Admission: RE | Admit: 2017-10-17 | Discharge: 2017-10-17 | Disposition: A | Discharge status: Home / Self Care | Payer: Managed Care, Other (non HMO) | Source: Ambulatory Visit | Attending: Family Medicine | Admitting: Family Medicine

## 2017-10-17 VITALS — BP 160/80 | HR 60 | Temp 99.0°F | Ht 68.0 in | Wt 152.0 lb

## 2017-10-17 DIAGNOSIS — M546 Pain in thoracic spine: Secondary | ICD-10-CM

## 2017-10-17 MED ORDER — PREDNISONE 20 MG PO TABS: ORAL_TABLET | ORAL | 0 refills | Status: DC

## 2017-10-17 NOTE — Progress Notes (Signed)
Back pain continues.  He was fatigued taking flexeril.  Ibuprofen helps a little but he still has pain.    Midthoracic back pain, just lateral to midline, right greater than left, near the level of the inferior portion of the scapula.  He has pain with twisting.  Hot shower still helps.  Less pain in the AM on waking but more pain as he is up and active.   He can have some burning pain.  He has some occ change in sensation on the back, in dermatomal distribution B.  Pain can radiate around B side of the chest wall, ie dermatomal.  Prev rash resolved.  No leg sx.  No arm sx.  No B/B sx.   No weakness.   Meds, vitals, and allergies reviewed.   ROS: Per HPI unless specifically indicated in ROS section   nad ncat Neck supple, no LA rrr ctab Mid T spine pain, just to R of midline.   No rash. No cva pain.  S/S grossly wnl ext x4 able to bear weight.    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

## 2017-10-17 NOTE — Patient Instructions (Signed)
Start prednisone.  Take 2 a day for 5 days, then 1 a day for 5 days, with food. Don't take with aleve/ibuprofen. If not better, then let me know and we may have to get you over to the ortho clinic.   Use ice or heat, whichever helps.  Take care.  Glad to see you.  We'll contact you with your xray report.

## 2017-10-18 NOTE — Assessment & Plan Note (Signed)
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  Discussed with patient about options.  I presume that he has an arthritic component that is contributing to nerve root irritation in the T-spine.  Reasonable to stop ibuprofen and change to prednisone with routine steroid cautions given.  See after visit summary.  If he is not improved we can refer him over to orthopedics.  Okay for outpatient follow-up.

## 2018-01-08 ENCOUNTER — Other Ambulatory Visit: Payer: Self-pay | Admitting: Family Medicine

## 2018-01-08 NOTE — Telephone Encounter (Signed)
Electronic refill request. Sildenafil Last office visit:   10/17/17 Last Filled:    50 tablet prn 11/15/2016  Please advise.

## 2018-01-09 NOTE — Telephone Encounter (Signed)
Sent. Thanks.   

## 2018-01-23 ENCOUNTER — Other Ambulatory Visit: Payer: Self-pay | Admitting: Family Medicine

## 2018-01-23 ENCOUNTER — Other Ambulatory Visit (INDEPENDENT_AMBULATORY_CARE_PROVIDER_SITE_OTHER): Payer: Managed Care, Other (non HMO)

## 2018-01-23 DIAGNOSIS — I10 Essential (primary) hypertension: Secondary | ICD-10-CM

## 2018-01-23 LAB — LIPID PANEL
CHOL/HDL RATIO: 3
Cholesterol: 163 mg/dL (ref 0–200)
HDL: 57.3 mg/dL (ref >39.00)
LDL Cholesterol: 89 mg/dL (ref 0–99)
NonHDL: 105.71
TRIGLYCERIDES: 84 mg/dL (ref 0.0–149.0)
VLDL: 16.8 mg/dL (ref 0.0–40.0)

## 2018-01-23 LAB — COMPREHENSIVE METABOLIC PANEL
ALK PHOS: 34 U/L — AB (ref 39–117)
ALT: 18 U/L (ref 0–53)
AST: 15 U/L (ref 0–37)
Albumin: 4.5 g/dL (ref 3.5–5.2)
BILIRUBIN TOTAL: 3 mg/dL — AB (ref 0.2–1.2)
BUN: 18 mg/dL (ref 6–23)
CALCIUM: 9.3 mg/dL (ref 8.4–10.5)
CO2: 34 mEq/L — ABNORMAL HIGH (ref 19–32)
Chloride: 100 mEq/L (ref 96–112)
Creatinine, Ser: 1.02 mg/dL (ref 0.40–1.50)
GFR: 78.69 mL/min (ref >60.00)
Glucose, Bld: 114 mg/dL — ABNORMAL HIGH (ref 70–99)
POTASSIUM: 3.3 meq/L — AB (ref 3.5–5.1)
Sodium: 141 mEq/L (ref 135–145)
TOTAL PROTEIN: 6.9 g/dL (ref 6.0–8.3)

## 2018-01-23 LAB — TSH: TSH: 3.35 u[IU]/mL (ref 0.35–4.50)

## 2018-01-26 ENCOUNTER — Encounter: Payer: Self-pay | Admitting: Family Medicine

## 2018-01-26 ENCOUNTER — Ambulatory Visit (INDEPENDENT_AMBULATORY_CARE_PROVIDER_SITE_OTHER): Payer: Managed Care, Other (non HMO) | Admitting: Family Medicine

## 2018-01-26 VITALS — BP 156/84 | HR 60 | Temp 98.6°F | Ht 68.0 in | Wt 154.8 lb

## 2018-01-26 DIAGNOSIS — E039 Hypothyroidism, unspecified: Secondary | ICD-10-CM

## 2018-01-26 DIAGNOSIS — Z0000 Encounter for general adult medical examination without abnormal findings: Secondary | ICD-10-CM

## 2018-01-26 DIAGNOSIS — Z7189 Other specified counseling: Secondary | ICD-10-CM

## 2018-01-26 DIAGNOSIS — I10 Essential (primary) hypertension: Secondary | ICD-10-CM

## 2018-01-26 MED ORDER — AMLODIPINE BESYLATE 5 MG PO TABS
5.0000 mg | ORAL_TABLET | Freq: Every day | ORAL | 3 refills | Status: DC
Start: 2018-01-26 — End: 2019-03-08

## 2018-01-26 MED ORDER — LISINOPRIL 40 MG PO TABS: 40.0000 mg | ORAL_TABLET | Freq: Every day | ORAL | Status: DC

## 2018-01-26 NOTE — Progress Notes (Signed)
CPE- See plan.  Routine anticipatory guidance given to patient.  See health maintenance.  The possibility exists that previously documented standard health maintenance information may have been brought forward from a previous encounter into this note.  If needed, that same information has been updated to reflect the current situation based on today's encounter.    Tetanus 2014  PNA shot not due. D/w pt.  Shingles out of stock.   Flu shot can be done in the fall  Colonoscopy 2018  PSA prev wnl, defer as of 2019, d/w pt about pros and cons.   Living will d/w pt. Son designated if patient were incapacitated.  Diet and exercise d/w pt. Encouraged both.  HCV neg prev.   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   Hypertension:    Using medication without problems or lightheadedness: occ lightheaded in the AM with squatting and then getting up quickly.   O/w no troubles.   Chest pain with exertion:no Edema:no Short of breath:no Average home BPs: similar to today.   He is on a low salt diet.   He didn't see much change in BP with addition of HCTZ.   Hypothyroidism. Compliant.  TSH wnl.  D/w pt.  No ADE on med.    He has some back pain with long days and repetitive lifting.   It comes and goes, with some times of complete resolution.  Prev dermatomal sx resolved.  He'll update me if needed.    ED.  Sildenafil works fairly well. We discussed options.  Changing to another medication may not be any more effective, d/w pt.   PMH and SH reviewed  Meds, vitals, and allergies reviewed.   ROS: Per HPI.  Unless specifically indicated otherwise in HPI, the patient denies:  General: fever. Eyes: acute vision changes ENT: sore throat Cardiovascular: chest pain Respiratory: SOB GI: vomiting GU: dysuria Musculoskeletal: acute back pain Derm: acute rash Neuro: acute motor dysfunction Psych: worsening mood Endocrine: polydipsia Heme: bleeding Allergy: hayfever  GEN: nad, alert and oriented HEENT:  mucous membranes moist NECK: supple w/o LA CV: rrr. PULM: ctab, no inc wob ABD: soft, +bs EXT: no edema SKIN: no acute rash

## 2018-01-26 NOTE — Patient Instructions (Signed)
Stop the HCTZ and add on amlopidine.   Keep taking 40mg  lisinopril.  Update me in about 2 weeks.  I would get a flu shot each fall.   Take care.  Glad to see you.

## 2018-01-27 DIAGNOSIS — Z7189 Other specified counseling: Secondary | ICD-10-CM | POA: Insufficient documentation

## 2018-01-27 NOTE — Assessment & Plan Note (Signed)
TSH normal.  No adverse effect on medication.  Compliant.  Continue as is.  He agrees.

## 2018-01-27 NOTE — Assessment & Plan Note (Signed)
Stop the HCTZ and add on amlopidine.   Keep taking 40mg  lisinopril.  Update me in about 2 weeks.  He agrees.  He did not see much change with addition about her chlorothiazide.  He is already on a low-salt diet.  Labs discussed with patient.

## 2018-01-27 NOTE — Assessment & Plan Note (Signed)
Living will d/w pt.  Son designated if patient were incapacitated.   

## 2018-01-27 NOTE — Assessment & Plan Note (Signed)
At baseline.  No abdominal pain.  No jaundice.  Labs discussed with patient.

## 2018-01-27 NOTE — Assessment & Plan Note (Signed)
Tetanus 2014  PNA shot not due. D/w pt.  Shingles out of stock.   Flu shot can be done in the fall  Colonoscopy 2018  PSA prev wnl, defer as of 2019, d/w pt about pros and cons of testing.  Living will d/w pt. Son designated if patient were incapacitated.  Diet and exercise d/w pt. Encouraged both.  HCV neg prev.   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

## 2018-03-05 ENCOUNTER — Telehealth: Payer: Managed Care, Other (non HMO) | Admitting: Nurse Practitioner

## 2018-03-05 DIAGNOSIS — H9203 Otalgia, bilateral: Secondary | ICD-10-CM

## 2018-03-05 MED ORDER — FLUTICASONE PROPIONATE 50 MCG/ACT NA SUSP: NASAL | 0 refills | Status: DC

## 2018-03-05 NOTE — Progress Notes (Signed)

## 2018-03-20 ENCOUNTER — Encounter: Payer: Self-pay | Admitting: Family Medicine

## 2018-03-22 ENCOUNTER — Telehealth: Payer: Self-pay | Admitting: Family Medicine

## 2018-03-22 NOTE — Telephone Encounter (Signed)
Patient advised.

## 2018-03-22 NOTE — Telephone Encounter (Signed)
Call pt.  See mychart message.  If patient isn't having pain but has unilateral or bilateral ear congestion/pressure and/or muffled hearing consistent with eustachian tube dysfunction then he may be able to treat this with over-the-counter medications.  He can try Flonase, 2 sprays in each nostril once a day.  Please instruct him to gently perform the Valsalva maneuver like he is trying to "pop his ears" when a person is changing altitude in the mountains.  If that does not help then he will need office visit.  Thanks.

## 2018-03-27 ENCOUNTER — Encounter: Payer: Self-pay | Admitting: Family Medicine

## 2018-03-27 ENCOUNTER — Ambulatory Visit: Payer: Managed Care, Other (non HMO) | Admitting: Family Medicine

## 2018-03-27 VITALS — BP 122/70 | HR 64 | Temp 98.7°F | Ht 68.0 in | Wt 152.8 lb

## 2018-03-27 DIAGNOSIS — H6980 Other specified disorders of Eustachian tube, unspecified ear: Secondary | ICD-10-CM | POA: Insufficient documentation

## 2018-03-27 MED ORDER — PREDNISONE 20 MG PO TABS
20.0000 mg | ORAL_TABLET | Freq: Every day | ORAL | 0 refills | Status: DC
Start: 2018-03-27 — End: 2018-05-31

## 2018-03-27 NOTE — Assessment & Plan Note (Signed)
Likely.  D/w pt.  Stop flonase. Take prednisone with food.  Steroid cautions d/w pt. Gently try to pop ears w/valsalva.  If not better, then he'll let me know and we'll set up ENT appointment.  No sign of AOM.  Sinuses not ttp

## 2018-03-27 NOTE — Progress Notes (Signed)
duration of symptoms: about 6 weeks, after a beach trip.   Initially stuffy with R ear clogged, then had R sided HA.  Seen at [facility].  Treated at that point with augmentin.   The R sided pain got better.  Then residual ear clogged and still stuffy.  R ear feels more clogged if sleeping on R side.    He has tried pseudophed, chewing gum to try to help with presumed ETD.    He had L sided nose bleed recently.  He has passed L sided bloody clot.  He started back on flonase in the last 1-2 weeks.    No fevers now.  Some occ HA recently, frontal and top of the head.    Per HPI unless specifically indicated in ROS section  Meds, vitals, and allergies reviewed.   GEN: nad, alert and oriented HEENT: mucous membranes moist, TM w/o erythema, nasal epithelium injected, OP without cobblestoning NECK: supple w/o LA CV: rrr. PULM: ctab, no inc wob Sinuses not ttp   L>R SOM noted, no erythema.   R sided ETD at time of exam.

## 2018-03-27 NOTE — Patient Instructions (Signed)
Stop the flonase. Take prednisone with food.  Gently try to pop your ears.  If not better, then let me know and we'll set up ENT appointment.  Take care.  Glad to see you.

## 2018-05-02 ENCOUNTER — Other Ambulatory Visit: Payer: Self-pay

## 2018-05-02 ENCOUNTER — Other Ambulatory Visit: Payer: Self-pay | Admitting: Family Medicine

## 2018-05-02 DIAGNOSIS — I10 Essential (primary) hypertension: Secondary | ICD-10-CM

## 2018-05-02 MED ORDER — LISINOPRIL 40 MG PO TABS: 40.0000 mg | ORAL_TABLET | Freq: Every day | ORAL | 2 refills | Status: DC

## 2018-05-02 NOTE — Telephone Encounter (Signed)
Chad Avila with [facility] requesting refill lisinopril 40 mg taking one daily; pt request 90 day rx.last seen 01/26/18. Refilled per protocol.

## 2018-05-31 ENCOUNTER — Encounter: Payer: Self-pay | Admitting: Family Medicine

## 2018-05-31 ENCOUNTER — Ambulatory Visit: Payer: Managed Care, Other (non HMO) | Admitting: Family Medicine

## 2018-05-31 VITALS — BP 140/78 | HR 55 | Temp 98.6°F | Ht 68.0 in | Wt 159.5 lb

## 2018-05-31 DIAGNOSIS — K4090 Unilateral inguinal hernia, without obstruction or gangrene, not specified as recurrent: Secondary | ICD-10-CM

## 2018-05-31 NOTE — Patient Instructions (Signed)
I think you have bilateral hernias.  Limit heavy lifting.  Update me as needed.  We will call about your referral.  Rosaria Ferries or Azalee Course will call you if you don't see one of them on the way out.  Take care.  Glad to see you.

## 2018-05-31 NOTE — Progress Notes (Signed)
LLQ and hip area pain.  Episodic flares.  Taking ibuprofen.  Present for about 3 weeks.  Pain with movement, with hip flexion, near the inguinal canal.  No mass felt.  No fevers.  No dysuria.  No blood in stool.  No vomiting, no diarrhea.  No R sided sx.  He's had mult flares of the same pain, with flares lasting weeks.    Was previously told that he had bilateral inguinal hernias.  Meds, vitals, and allergies reviewed.   ROS: Per HPI unless specifically indicated in ROS section   nad ncat rrr ctab abd soft, he does appear to have bilateral inguinal hernias noted, left more tender than the right.  Abdominal exam benign otherwise.  Normal bowel sounds.  No rebound. Extremities well perfused.

## 2018-06-03 DIAGNOSIS — K4090 Unilateral inguinal hernia, without obstruction or gangrene, not specified as recurrent: Secondary | ICD-10-CM | POA: Insufficient documentation

## 2018-06-03 NOTE — Assessment & Plan Note (Signed)
Anatomy and routine cautions discussed with patient.  Refer back to general surgery.  He agrees.  Okay for outpatient follow-up.  Limit heavy lifting. No emergent symptoms.

## 2018-06-26 ENCOUNTER — Ambulatory Visit: Payer: Self-pay | Admitting: General Surgery

## 2018-07-13 ENCOUNTER — Telehealth: Payer: Self-pay

## 2018-07-13 NOTE — Telephone Encounter (Signed)
Left message for patient to call back to follow up on cancelled appointment with general surgeon

## 2018-07-24 ENCOUNTER — Ambulatory Visit: Payer: Self-pay | Admitting: General Surgery

## 2018-07-25 ENCOUNTER — Ambulatory Visit: Payer: Self-pay | Admitting: Surgery

## 2018-10-17 ENCOUNTER — Telehealth: Payer: Managed Care, Other (non HMO) | Admitting: Family

## 2018-10-17 ENCOUNTER — Telehealth: Payer: Self-pay | Admitting: Family Medicine

## 2018-10-17 DIAGNOSIS — R300 Dysuria: Secondary | ICD-10-CM

## 2018-10-17 NOTE — Telephone Encounter (Signed)
Called pt to discuss scheduling appt per mychart appt request. See request below:   Appointment Request From: Chad Avila    With Provider: Elsie Stain, MD [facility] [place]    Preferred Date Range: 10/17/2018 - 10/17/2018    Preferred Times: Wednesday Afternoon    Reason for visit: Office Visit    Comments:  Urinary issues, E-Vist recommended that I see a provider today. Need to have an appointment for 4:00 pm or later if possible.

## 2018-10-17 NOTE — Progress Notes (Signed)
We are sorry that you are not feeling well.  Here is how we plan to help!  Male bladder infections are not very common.  We worry about prostate or kidney conditions.  The standard of care is to examine the abdomen and kidneys, and to do a urine and blood test to make sure that something more serious is not going on.   NOTE: If you entered your credit card information for this eVisit, you will not be charged. You may see a "hold" on your card for the $35 but that hold will drop off and you will not have a charge processed.  We recommend that you see a provider today.  If your doctor's office is closed [facility] has the following Urgent Cares:  If you need care fast and have a high deductible or no insurance consider:  [email or web address] to reserve your spot online an avoid wait times  [facility] [address] [place], [place] [address] Modified hours of operation: Monday-Friday, 10 AM to 6 PM  Saturday & Sunday 10 AM to 4 PM  [facility]New [facility] [address], [place], [place] [address] *Just off [address], across the road from [place] hours of operation: Monday-Friday, 10 AM to 5 PM  Closed Saturday & Sunday  [facility]'s modified hours of operation will be in effect from Wednesday, April 1st through Thursday, April 30th.   The following sites will take your  insurance:  . [facility] [place] a Provider at this Location  [address] [place], [place] [address] . 10 am to 8 pm Monday-Friday . 12 pm to 8 pm Saturday-Sunday   . [facility] Urgent [facility] at [place] a Provider at this Location  [address] [place], [place] [address] . 8 am to 8 pm Monday-Friday . 9 am to 6 pm Saturday . 11 am to 6 pm Sunday   . [facility] at [place] Get Driving Directions  [address] [place], [place] [address] . 8 am to 8 pm Monday-Friday . 8 am to 4 pm Saturday-Sunday   Your e-visit answers were reviewed by a board certified advanced clinical practitioner to complete your personal care plan.  Thank you for using e-Visits.  Greater than 5 minutes, yet less than 10 minutes of time have been spent researching, coordinating, and implementing care for this patient today.  Thank you for the details you included in the comment boxes. Those details are very helpful in determining the best course of treatment for you and help [place] to provide the best care.

## 2018-10-18 ENCOUNTER — Ambulatory Visit: Payer: No Typology Code available for payment source | Admitting: Family Medicine

## 2018-10-18 ENCOUNTER — Encounter: Payer: Self-pay | Admitting: Family Medicine

## 2018-10-18 ENCOUNTER — Other Ambulatory Visit: Payer: Self-pay

## 2018-10-18 VITALS — BP 144/82 | HR 68 | Temp 98.8°F | Wt 158.3 lb

## 2018-10-18 DIAGNOSIS — R300 Dysuria: Secondary | ICD-10-CM

## 2018-10-18 LAB — POC URINALSYSI DIPSTICK (AUTOMATED)
Bilirubin, UA: NEGATIVE
Glucose, UA: NEGATIVE
Ketones, UA: NEGATIVE
Nitrite, UA: POSITIVE
Protein, UA: POSITIVE — AB
Spec Grav, UA: 1.025 (ref 1.010–1.025)
Urobilinogen, UA: 1 E.U./dL
pH, UA: 6 (ref 5.0–8.0)

## 2018-10-18 MED ORDER — SULFAMETHOXAZOLE-TRIMETHOPRIM 400-80 MG PO TABS: 1.0000 | ORAL_TABLET | Freq: Two times a day (BID) | ORAL | 0 refills | Status: DC

## 2018-10-18 MED ORDER — TAMSULOSIN HCL 0.4 MG PO CAPS: 0.4000 mg | ORAL_CAPSULE | Freq: Every day | ORAL | 3 refills | Status: DC

## 2018-10-18 NOTE — Patient Instructions (Signed)
We'll contact you with your lab report. Start septra today.  Take it twice a day.  Use flomax daily in the meantime.  You may be able to stop that after you are done with the antibiotics but I included refills just in case.  Update me as needed.  Take care.  Glad to see you.

## 2018-10-18 NOTE — Progress Notes (Signed)
Urinary sx in the last few days.  Frequency noted recently, more than typical.  Has burning and needs to strain with urination. No blood in urine.  No fevers.  Two nights ago tmax 99, none above 100.  Some occ chills/aches, mild.  No testicle pain.  12 trips to bathroom today, with less output per void than normal.  No news meds. No discharge.  No new partners.  Some lower pelvic pressure when sitting and standing.    Meds, vitals, and allergies reviewed.   ROS: Per HPI unless specifically indicated in ROS section   nad ncat rrr ctab abd soft, Lower abd ttp, no rebound Prostate ttp and B enlargement.  Extremities without edema.

## 2018-10-19 LAB — URINE CULTURE
MICRO NUMBER:: 417119
Result:: NO GROWTH
SPECIMEN QUALITY:: ADEQUATE

## 2018-10-21 DIAGNOSIS — R300 Dysuria: Secondary | ICD-10-CM | POA: Insufficient documentation

## 2018-10-21 NOTE — Assessment & Plan Note (Signed)
Likely prostatitis.  Okay for outpatient follow-up.  Discussed with patient about pathophysiology and options for treatment. Start septra today.  See notes on urinalysis and urine culture. Use flomax daily in the meantime.  He may be able to stop that when done with the antibiotics but I included refills just in case.  He will update me as needed.  He agrees with plan.

## 2018-10-22 ENCOUNTER — Telehealth: Payer: Self-pay | Admitting: *Deleted

## 2018-10-22 ENCOUNTER — Encounter: Payer: Self-pay | Admitting: Family Medicine

## 2018-10-22 ENCOUNTER — Other Ambulatory Visit: Payer: Self-pay | Admitting: Family Medicine

## 2018-10-22 MED ORDER — CIPROFLOXACIN HCL 500 MG PO TABS: 500.0000 mg | ORAL_TABLET | Freq: Two times a day (BID) | ORAL | 0 refills | Status: DC

## 2018-10-22 NOTE — Telephone Encounter (Signed)
Left VM requesting pt to call the office back regarding urine cx results  

## 2018-12-12 ENCOUNTER — Other Ambulatory Visit: Payer: Self-pay | Admitting: Family Medicine

## 2018-12-12 DIAGNOSIS — I10 Essential (primary) hypertension: Secondary | ICD-10-CM

## 2019-02-24 ENCOUNTER — Other Ambulatory Visit: Payer: Self-pay | Admitting: Family Medicine

## 2019-02-24 DIAGNOSIS — I10 Essential (primary) hypertension: Secondary | ICD-10-CM

## 2019-02-28 ENCOUNTER — Encounter: Payer: No Typology Code available for payment source | Admitting: Family Medicine

## 2019-03-01 ENCOUNTER — Other Ambulatory Visit (INDEPENDENT_AMBULATORY_CARE_PROVIDER_SITE_OTHER): Payer: No Typology Code available for payment source

## 2019-03-01 ENCOUNTER — Other Ambulatory Visit: Payer: Self-pay

## 2019-03-01 ENCOUNTER — Other Ambulatory Visit: Payer: Self-pay | Admitting: Family Medicine

## 2019-03-01 DIAGNOSIS — I10 Essential (primary) hypertension: Secondary | ICD-10-CM

## 2019-03-01 LAB — LIPID PANEL
Cholesterol: 167 mg/dL (ref 0–200)
HDL: 49.1 mg/dL (ref >39.00)
LDL Cholesterol: 105 mg/dL — ABNORMAL HIGH (ref 0–99)
NonHDL: 117.93
Total CHOL/HDL Ratio: 3
Triglycerides: 63 mg/dL (ref 0.0–149.0)
VLDL: 12.6 mg/dL (ref 0.0–40.0)

## 2019-03-01 LAB — COMPREHENSIVE METABOLIC PANEL
ALT: 15 U/L (ref 0–53)
AST: 14 U/L (ref 0–37)
Albumin: 4.6 g/dL (ref 3.5–5.2)
Alkaline Phosphatase: 39 U/L (ref 39–117)
BUN: 17 mg/dL (ref 6–23)
CO2: 32 mEq/L (ref 19–32)
Calcium: 9.3 mg/dL (ref 8.4–10.5)
Chloride: 102 mEq/L (ref 96–112)
Creatinine, Ser: 0.96 mg/dL (ref 0.40–1.50)
GFR: 79.12 mL/min (ref >60.00)
Glucose, Bld: 97 mg/dL (ref 70–99)
Potassium: 3.7 mEq/L (ref 3.5–5.1)
Sodium: 142 mEq/L (ref 135–145)
Total Bilirubin: 2.2 mg/dL — ABNORMAL HIGH (ref 0.2–1.2)
Total Protein: 6.9 g/dL (ref 6.0–8.3)

## 2019-03-01 LAB — TSH: TSH: 3.78 u[IU]/mL (ref 0.35–4.50)

## 2019-03-01 NOTE — Telephone Encounter (Signed)
Electronic refill request. Sildenafil Last office visit:   10/18/2018 Last Filled:    50 tablet prn 01/09/2018  Please advise.

## 2019-03-03 NOTE — Telephone Encounter (Signed)
Sent. Thanks.   

## 2019-03-08 ENCOUNTER — Encounter: Payer: Self-pay | Admitting: Family Medicine

## 2019-03-08 ENCOUNTER — Other Ambulatory Visit: Payer: Self-pay

## 2019-03-08 ENCOUNTER — Ambulatory Visit (INDEPENDENT_AMBULATORY_CARE_PROVIDER_SITE_OTHER): Payer: No Typology Code available for payment source | Admitting: Family Medicine

## 2019-03-08 VITALS — BP 142/80 | HR 70 | Temp 98.5°F | Ht 68.0 in | Wt 161.1 lb

## 2019-03-08 DIAGNOSIS — Z0000 Encounter for general adult medical examination without abnormal findings: Secondary | ICD-10-CM

## 2019-03-08 DIAGNOSIS — M2550 Pain in unspecified joint: Secondary | ICD-10-CM

## 2019-03-08 DIAGNOSIS — G4730 Sleep apnea, unspecified: Secondary | ICD-10-CM

## 2019-03-08 DIAGNOSIS — E785 Hyperlipidemia, unspecified: Secondary | ICD-10-CM

## 2019-03-08 DIAGNOSIS — I10 Essential (primary) hypertension: Secondary | ICD-10-CM

## 2019-03-08 DIAGNOSIS — E039 Hypothyroidism, unspecified: Secondary | ICD-10-CM

## 2019-03-08 DIAGNOSIS — Z7189 Other specified counseling: Secondary | ICD-10-CM

## 2019-03-08 MED ORDER — LISINOPRIL 40 MG PO TABS: 40.0000 mg | ORAL_TABLET | Freq: Every day | ORAL | 3 refills | Status: DC

## 2019-03-08 MED ORDER — ATORVASTATIN CALCIUM 10 MG PO TABS: 10.0000 mg | ORAL_TABLET | Freq: Every day | ORAL | 3 refills | Status: DC

## 2019-03-08 MED ORDER — LEVOTHYROXINE SODIUM 200 MCG PO TABS: ORAL_TABLET | ORAL | 3 refills | Status: DC

## 2019-03-08 MED ORDER — AMLODIPINE BESYLATE 5 MG PO TABS: 5.0000 mg | ORAL_TABLET | Freq: Every day | ORAL | 3 refills | Status: AC

## 2019-03-08 NOTE — Patient Instructions (Signed)
If you don't have aches on the lipitor, then get a fasting lab appointment in about 2 months.  I would get a flu shot each fall.   Update me as needed.  Take care.  Glad to see you.

## 2019-03-08 NOTE — Progress Notes (Signed)
CPE- See plan.  Routine anticipatory guidance given to patient.  See health maintenance.  The possibility exists that previously documented standard health maintenance information may have been brought forward from a previous encounter into this note.  If needed, that same information has been updated to reflect the current situation based on today's encounter.    His mother died this year.  Condolences offered.  He is trying to help care for his father, who is 65.    Tetanus 2014  PNA shot not due. D/w pt.  Shingles done Flu shot can be done in the fall  Colonoscopy 2018 PSA prev wnl, defer as of 2020, d/w pt about pros and cons of testing.  Living will d/w pt. Son designated if patient were incapacitated.  Diet and exercise d/w pt. Encouraged both.  HCV neg prev.   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   Joint pain clearly better with glucosamine.   Hypertension:    Using medication without problems or lightheadedness: yes Chest pain with exertion:no Edema:no Short of breath:no Labs d/w pt.   Still using CPAP, compliant.  Sleeping well.  Not waking tired.   Hypothyroidism.  No ADE on med.  No neck mass.  No dysphagia.  TSH wnl.  Compliant.  Labs d/w pt.    PMH and SH reviewed  Meds, vitals, and allergies reviewed.   ROS: Per HPI.  Unless specifically indicated otherwise in HPI, the patient denies:  General: fever. Eyes: acute vision changes ENT: sore throat Cardiovascular: chest pain Respiratory: SOB GI: vomiting GU: dysuria Musculoskeletal: acute back pain Derm: acute rash Neuro: acute motor dysfunction Psych: worsening mood Endocrine: polydipsia Heme: bleeding Allergy: hayfever  GEN: nad, alert and oriented HEENT: ncat NECK: supple w/o LA CV: rrr. PULM: ctab, no inc wob ABD: soft, +bs EXT: no edema SKIN: no acute rash  The 10-year ASCVD risk score Mikey Bussing DC Jr., et al., 2013) is: 12.3%   Values used to calculate the score:     Age: 62 years     Sex: Male   Is Non-Hispanic African American: No     Diabetic: No     Tobacco smoker: No     Systolic Blood Pressure: [id number] mmHg     Is BP treated: Yes     HDL Cholesterol: 49.1 mg/dL     Total Cholesterol: 167 mg/dL

## 2019-03-10 ENCOUNTER — Encounter: Payer: Self-pay | Admitting: Family Medicine

## 2019-03-10 DIAGNOSIS — E785 Hyperlipidemia, unspecified: Secondary | ICD-10-CM | POA: Insufficient documentation

## 2019-03-10 DIAGNOSIS — M2550 Pain in unspecified joint: Secondary | ICD-10-CM | POA: Insufficient documentation

## 2019-03-10 DIAGNOSIS — G4730 Sleep apnea, unspecified: Secondary | ICD-10-CM | POA: Insufficient documentation

## 2019-03-10 NOTE — Assessment & Plan Note (Signed)
  Tetanus 2014  PNA shot not due. D/w pt.  Shingles done Flu shot can be done in the fall  Colonoscopy 2018 PSA prev wnl, defer as of 2020, d/w pt about pros and cons of testing.  Living will d/w pt. Son designated if patient were incapacitated.  Diet and exercise d/w pt. Encouraged both.  HCV neg prev.   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

## 2019-03-10 NOTE — Assessment & Plan Note (Signed)
No ADE on med.  No neck mass.  No dysphagia.  TSH wnl.  Compliant.  Labs d/w pt.

## 2019-03-10 NOTE — Assessment & Plan Note (Signed)
No change in meds at this point.  Continue work on diet and exercise.  Update me as needed.

## 2019-03-10 NOTE — Assessment & Plan Note (Signed)
Living will d/w pt. Son designated if patient were incapacitated.

## 2019-03-10 NOTE — Assessment & Plan Note (Signed)
Joint pain clearly better with glucosamine.

## 2019-03-10 NOTE — Assessment & Plan Note (Signed)
Discussed statin indication given his ASCVD score.  Rationale for use discussed.  Routine cautions given.  He will start Lipitor 10 mg and if he can tolerate that without significant increase in aches and he can return for fasting lipids.  Update me as needed.  He agrees.  See after visit summary.

## 2019-03-10 NOTE — Assessment & Plan Note (Signed)
Still using CPAP, compliant.  Sleeping well.  Not waking tired.  Continue as is.  He agrees.

## 2019-07-01 ENCOUNTER — Encounter: Payer: Self-pay | Admitting: Family Medicine

## 2019-07-02 ENCOUNTER — Other Ambulatory Visit: Payer: Self-pay | Admitting: *Deleted

## 2019-07-02 DIAGNOSIS — I10 Essential (primary) hypertension: Secondary | ICD-10-CM

## 2019-07-02 MED ORDER — LISINOPRIL 40 MG PO TABS: 40.0000 mg | ORAL_TABLET | Freq: Every day | ORAL | 3 refills | Status: DC

## 2019-07-02 MED ORDER — ATORVASTATIN CALCIUM 10 MG PO TABS: 10.0000 mg | ORAL_TABLET | Freq: Every day | ORAL | 3 refills | Status: DC

## 2019-07-02 MED ORDER — LEVOTHYROXINE SODIUM 200 MCG PO TABS: ORAL_TABLET | ORAL | 3 refills | Status: DC

## 2019-07-02 MED ORDER — SILDENAFIL CITRATE 20 MG PO TABS: ORAL_TABLET | ORAL | 99 refills | Status: AC

## 2019-09-09 ENCOUNTER — Other Ambulatory Visit: Payer: Self-pay

## 2019-09-09 ENCOUNTER — Encounter: Payer: Self-pay | Admitting: Family Medicine

## 2019-09-09 ENCOUNTER — Ambulatory Visit (INDEPENDENT_AMBULATORY_CARE_PROVIDER_SITE_OTHER)
Admission: RE | Admit: 2019-09-09 | Discharge: 2019-09-09 | Disposition: A | Discharge status: Home / Self Care | Payer: No Typology Code available for payment source | Source: Ambulatory Visit | Attending: Family Medicine | Admitting: Family Medicine

## 2019-09-09 ENCOUNTER — Ambulatory Visit (INDEPENDENT_AMBULATORY_CARE_PROVIDER_SITE_OTHER): Payer: No Typology Code available for payment source | Admitting: Family Medicine

## 2019-09-09 VITALS — BP 142/70 | HR 68 | Temp 96.7°F | Ht 68.0 in | Wt 166.4 lb

## 2019-09-09 DIAGNOSIS — M2550 Pain in unspecified joint: Secondary | ICD-10-CM

## 2019-09-09 DIAGNOSIS — R351 Nocturia: Secondary | ICD-10-CM | POA: Diagnosis not present

## 2019-09-09 DIAGNOSIS — M25559 Pain in unspecified hip: Secondary | ICD-10-CM | POA: Diagnosis not present

## 2019-09-09 DIAGNOSIS — T50Z95A Adverse effect of other vaccines and biological substances, initial encounter: Secondary | ICD-10-CM | POA: Diagnosis not present

## 2019-09-09 DIAGNOSIS — E785 Hyperlipidemia, unspecified: Secondary | ICD-10-CM

## 2019-09-09 LAB — POC URINALSYSI DIPSTICK (AUTOMATED)
Bilirubin, UA: NEGATIVE
Blood, UA: NEGATIVE
Glucose, UA: NEGATIVE
Ketones, UA: NEGATIVE
Leukocytes, UA: NEGATIVE
Nitrite, UA: NEGATIVE
Protein, UA: NEGATIVE
Spec Grav, UA: >=1.03 — AB (ref 1.010–1.025)
Urobilinogen, UA: 0.2 E.U./dL
pH, UA: 6 (ref 5.0–8.0)

## 2019-09-09 NOTE — Patient Instructions (Addendum)
Go to the lab on the way out.   If you have mychart we'll likely use that to update you.    If your labs are unremarkable, then we can refer you to urology in [place].  I would not take the 2nd covid shot given the reaction you had.   Hold atorvastatin in the meantime.  Update me about the aches in about 10 days.   Take care.  Glad to see you.

## 2019-09-09 NOTE — Progress Notes (Signed)
This visit occurred during the SARS-CoV-2 public health emergency.  Safety protocols were in place, including screening questions prior to the visit, additional usage of staff PPE, and extensive cleaning of exam room while observing appropriate contact time as indicated for disinfecting solutions.  Gradual worsening of nocturia in the last few years, worse in the last ~8 months.  Up to 8 times at night.  Needs repeated voiding.  No burning with urination.  No blood in urine.  He tried flomax but had allergic reaction.   Joint pain.  Going on for about 1 year intermittently but worse recently.  Hip, knee, shoulder pain.  Can have bilateral sx.  Some days are better than others.  Some pain worse after laying down for a while. Hips and R shoulder most bothersome. Not sore to touch.  No trauma.  Not worse walking.  He has groin pain with leg crossing.    He had his first [place] on 09/05/2019.  He felt tightness in his throat after the vaccine.  Symptom onset was about 12 hours after the shot and lasted about 24 hours.  He also had some bilateral arm tingling concurrently.  No symptoms in the meantime or now.  Discussed avoiding second Covid vaccine given the throat symptoms that he had.  Meds, vitals, and allergies reviewed.   ROS: Per HPI unless specifically indicated in ROS section   GEN: nad, alert and oriented HEENT: ncat NECK: supple w/o LA, no stridor CV: rrr.  PULM: ctab, no inc wob, no wheeze ABD: soft, +bs EXT: no edema SKIN: Well-perfused. Similar hip exam bilaterally.  Normal hip internal range of motion.  Able to bear weight.  He does have some hip discomfort/groin discomfort with external rotation of the hips but does not have typical SI joint area pain.

## 2019-09-10 ENCOUNTER — Other Ambulatory Visit: Payer: Self-pay | Admitting: Family Medicine

## 2019-09-10 DIAGNOSIS — T50Z95A Adverse effect of other vaccines and biological substances, initial encounter: Secondary | ICD-10-CM | POA: Insufficient documentation

## 2019-09-10 DIAGNOSIS — R351 Nocturia: Secondary | ICD-10-CM

## 2019-09-10 LAB — PSA: PSA: 3.37 ng/mL (ref 0.10–4.00)

## 2019-09-10 NOTE — Assessment & Plan Note (Signed)
See joint pain discussion.  Hold statin for 10 days then update me.

## 2019-09-10 NOTE — Assessment & Plan Note (Signed)
PSA is higher than previous but still below 4.  Urinalysis unremarkable other than being concentrated.  Refer to urology.  He agrees.  Intolerant of Flomax.

## 2019-09-10 NOTE — Assessment & Plan Note (Signed)
He had his first [place] on 09/05/2019.  He felt tightness in his throat after the vaccine.  Symptom onset was about 12 hours after the shot and lasted about 24 hours.  He also had some bilateral arm tingling concurrently.  No symptoms in the meantime or now.  Discussed avoiding second Covid vaccine given the throat symptoms that he had. Still okay for outpatient follow-up.

## 2019-09-10 NOTE — Assessment & Plan Note (Signed)
This could be statin related or could be due to osteoarthritis.  See notes on follow-up imaging.  He will hold statin in the meantime and update me in about 10 days.

## 2019-10-03 ENCOUNTER — Ambulatory Visit: Payer: Self-pay | Admitting: Urology

## 2019-10-04 ENCOUNTER — Telehealth: Payer: Self-pay | Admitting: Family Medicine

## 2019-10-04 NOTE — Telephone Encounter (Signed)
Saw report that patient had 2nd dose of covid vaccine.  Please check to make sure he tolerated it.  Thanks.

## 2019-10-04 NOTE — Telephone Encounter (Signed)
Patient tolerated the 2nd dose of vaccine well, no side effects.

## 2019-10-06 NOTE — Telephone Encounter (Signed)
Noted. Thanks.

## 2019-10-09 ENCOUNTER — Ambulatory Visit (INDEPENDENT_AMBULATORY_CARE_PROVIDER_SITE_OTHER): Payer: No Typology Code available for payment source | Admitting: Urology

## 2019-10-09 ENCOUNTER — Other Ambulatory Visit: Payer: Self-pay

## 2019-10-09 ENCOUNTER — Encounter: Payer: Self-pay | Admitting: Urology

## 2019-10-09 VITALS — BP 180/104 | HR 76 | Ht 68.0 in | Wt 165.0 lb

## 2019-10-09 DIAGNOSIS — N138 Other obstructive and reflux uropathy: Secondary | ICD-10-CM

## 2019-10-09 DIAGNOSIS — N401 Enlarged prostate with lower urinary tract symptoms: Secondary | ICD-10-CM | POA: Diagnosis not present

## 2019-10-09 DIAGNOSIS — R339 Retention of urine, unspecified: Secondary | ICD-10-CM

## 2019-10-09 DIAGNOSIS — R351 Nocturia: Secondary | ICD-10-CM

## 2019-10-09 LAB — URINALYSIS, COMPLETE
Bilirubin, UA: NEGATIVE
Glucose, UA: NEGATIVE
Ketones, UA: NEGATIVE
Leukocytes,UA: NEGATIVE
Nitrite, UA: NEGATIVE
Protein,UA: NEGATIVE
RBC, UA: NEGATIVE
Specific Gravity, UA: 1.015 (ref 1.005–1.030)
Urobilinogen, Ur: 0.2 mg/dL (ref 0.2–1.0)
pH, UA: 7 (ref 5.0–7.5)

## 2019-10-09 LAB — MICROSCOPIC EXAMINATION
Bacteria, UA: NONE SEEN
Epithelial Cells (non renal): NONE SEEN /hpf (ref 0–10)
RBC, Urine: NONE SEEN /hpf (ref 0–2)

## 2019-10-09 LAB — BLADDER SCAN AMB NON-IMAGING: Scan Result: 282

## 2019-10-09 MED ORDER — SILODOSIN 8 MG PO CAPS: 8.0000 mg | ORAL_CAPSULE | Freq: Every day | ORAL | 11 refills | Status: AC

## 2019-10-09 MED ORDER — TAMSULOSIN HCL 0.4 MG PO CAPS: 0.4000 mg | ORAL_CAPSULE | Freq: Every day | ORAL | 11 refills | Status: DC

## 2019-10-09 NOTE — Patient Instructions (Signed)
Benign Prostatic Hyperplasia  Benign prostatic hyperplasia (BPH) is an enlarged prostate gland that is caused by the normal aging process and not by cancer. The prostate is a walnut-sized gland that is involved in the production of semen. It is located in front of the rectum and below the bladder. The bladder stores urine and the urethra is the tube that carries the urine out of the body. The prostate may get bigger as a man gets older. An enlarged prostate can press on the urethra. This can make it harder to pass urine. The build-up of urine in the bladder can cause infection. Back pressure and infection may progress to bladder damage and kidney (renal) failure. What are the causes? This condition is part of a normal aging process. However, not all men develop problems from this condition. If the prostate enlarges away from the urethra, urine flow will not be blocked. If it enlarges toward the urethra and compresses it, there will be problems passing urine. What increases the risk? This condition is more likely to develop in men over the age of 50 years. What are the signs or symptoms? Symptoms of this condition include:  Getting up often during the night to urinate.  Needing to urinate frequently during the day.  Difficulty starting urine flow.  Decrease in size and strength of your urine stream.  Leaking (dribbling) after urinating.  Inability to pass urine. This needs immediate treatment.  Inability to completely empty your bladder.  Pain when you pass urine. This is more common if there is also an infection.  Urinary tract infection (UTI). How is this diagnosed? This condition is diagnosed based on your medical history, a physical exam, and your symptoms. Tests will also be done, such as:  A post-void bladder scan. This measures any amount of urine that may remain in your bladder after you finish urinating.  A digital rectal exam. In a rectal exam, your health care provider  checks your prostate by putting a lubricated, gloved finger into your rectum to feel the back of your prostate gland. This exam detects the size of your gland and any abnormal lumps or growths.  An exam of your urine (urinalysis).  A prostate specific antigen (PSA) screening. This is a blood test used to screen for prostate cancer.  An ultrasound. This test uses sound waves to electronically produce a picture of your prostate gland. Your health care provider may refer you to a specialist in kidney and prostate diseases (urologist). How is this treated? Once symptoms begin, your health care provider will monitor your condition (active surveillance or watchful waiting). Treatment for this condition will depend on the severity of your condition. Treatment may include:  Observation and yearly exams. This may be the only treatment needed if your condition and symptoms are mild.  Medicines to relieve your symptoms, including: ? Medicines to shrink the prostate. ? Medicines to relax the muscle of the prostate.  Surgery in severe cases. Surgery may include: ? Prostatectomy. In this procedure, the prostate tissue is removed completely through an open incision or with a laparoscope or robotics. ? Transurethral resection of the prostate (TURP). In this procedure, a tool is inserted through the opening at the tip of the penis (urethra). It is used to cut away tissue of the inner core of the prostate. The pieces are removed through the same opening of the penis. This removes the blockage. ? Transurethral incision (TUIP). In this procedure, small cuts are made in the prostate. This lessens   the prostate's pressure on the urethra. ? Transurethral microwave thermotherapy (TUMT). This procedure uses microwaves to create heat. The heat destroys and removes a small amount of prostate tissue. ? Transurethral needle ablation (TUNA). This procedure uses radio frequencies to destroy and remove a small amount of  prostate tissue. ? Interstitial laser coagulation (ILC). This procedure uses a laser to destroy and remove a small amount of prostate tissue. ? Transurethral electrovaporization (TUVP). This procedure uses electrodes to destroy and remove a small amount of prostate tissue. ? Prostatic urethral lift. This procedure inserts an implant to push the lobes of the prostate away from the urethra. Follow these instructions at home:  Take over-the-counter and prescription medicines only as told by your health care provider.  Monitor your symptoms for any changes. Contact your health care provider with any changes.  Avoid drinking large amounts of liquid before going to bed or out in public.  Avoid or reduce how much caffeine or alcohol you drink.  Give yourself time when you urinate.  Keep all follow-up visits as told by your health care provider. This is important. Contact a health care provider if:  You have unexplained back pain.  Your symptoms do not get better with treatment.  You develop side effects from the medicine you are taking.  Your urine becomes very dark or has a bad smell.  Your lower abdomen becomes distended and you have trouble passing your urine. Get help right away if:  You have a fever or chills.  You suddenly cannot urinate.  You feel lightheaded, or very dizzy, or you faint.  There are large amounts of blood or clots in the urine.  Your urinary problems become hard to manage.  You develop moderate to severe low back or flank pain. The flank is the side of your body between the ribs and the hip. These symptoms may represent a serious problem that is an emergency. Do not wait to see if the symptoms will go away. Get medical help right away. Call your local emergency services (911 in the [place] Do not drive yourself to the hospital. Summary  Benign prostatic hyperplasia (BPH) is an enlarged prostate that is caused by the normal aging process and not by  cancer.  An enlarged prostate can press on the urethra. This can make it hard to pass urine.  This condition is part of a normal aging process and is more likely to develop in men over the age of 50 years.  Get help right away if you suddenly cannot urinate. This information is not intended to replace advice given to you by your health care provider. Make sure you discuss any questions you have with your health care provider. Document Revised: 05/08/2018 Document Reviewed: 07/18/2016 Elsevier Patient Education  2020 Elsevier Inc. Tamsulosin capsules What is this medicine? TAMSULOSIN (tam SOO loe sin) is an alpha blocker. It is used to treat the signs and symptoms of an enlarged prostate in men. This condition is also called benign prostatic hyperplasia (BPH). This medicine may be used for other purposes; ask your health care provider or pharmacist if you have questions. COMMON BRAND NAME(S): Flomax What should I tell my health care provider before I take this medicine? They need to know if you have any of the following conditions:  advanced kidney disease  advanced liver disease  low blood pressure  prostate cancer  an unusual or allergic reaction to tamsulosin, sulfa drugs, other medicines, foods, dyes, or preservatives  pregnant or trying to   get pregnant  breast-feeding How should I use this medicine? Take this medicine by mouth about 30 minutes after the same meal every day. Follow the directions on the prescription label. Swallow the capsules whole with a glass of water. Do not crush, chew, or open capsules. Do not take your medicine more often than directed. Do not stop taking your medicine unless your doctor tells you to. Talk to your pediatrician regarding the use of this medicine in children. Special care may be needed. Overdosage: If you think you have taken too much of this medicine contact a poison control center or emergency room at once. NOTE: This medicine is only  for you. Do not share this medicine with others. What if I miss a dose? If you miss a dose, take it as soon as you can. If it is almost time for your next dose, take only that dose. Do not take double or extra doses. If you stop taking your medicine for several days or more, ask your doctor or health care professional what dose you should start back on. What may interact with this medicine?  cimetidine  fluoxetine  ketoconazole  medicines for erectile disfunction like sildenafil, tadalafil, vardenafil  medicines for high blood pressure  other alpha-blockers like alfuzosin, doxazosin, phentolamine, phenoxybenzamine, prazosin, terazosin  warfarin This list may not describe all possible interactions. Give your health care provider a list of all the medicines, herbs, non-prescription drugs, or dietary supplements you use. Also tell them if you smoke, drink alcohol, or use illegal drugs. Some items may interact with your medicine. What should I watch for while using this medicine? Visit your doctor or health care professional for regular check ups. You will need lab work done before you start this medicine and regularly while you are taking it. Check your blood pressure as directed. Ask your health care professional what your blood pressure should be, and when you should contact him or her. This medicine may make you feel dizzy or lightheaded. This is more likely to happen after the first dose, after an increase in dose, or during hot weather or exercise. Drinking alcohol and taking some medicines can make this worse. Do not drive, use machinery, or do anything that needs mental alertness until you know how this medicine affects you. Do not sit or stand up quickly. If you begin to feel dizzy, sit down until you feel better. These effects can decrease once your body adjusts to the medicine. Contact your doctor or health care professional right away if you have an erection that lasts longer than 4  hours or if it becomes painful. This may be a sign of a serious problem and must be treated right away to prevent permanent damage. If you are thinking of having cataract surgery, tell your eye surgeon that you have taken this medicine. What side effects may I notice from receiving this medicine? Side effects that you should report to your doctor or health care professional as soon as possible:  allergic reactions like skin rash or itching, hives, swelling of the lips, mouth, tongue, or throat  breathing problems  change in vision  feeling faint or lightheaded  irregular heartbeat  prolonged or painful erection  weakness Side effects that usually do not require medical attention (report to your doctor or health care professional if they continue or are bothersome):  back pain  change in sex drive or performance  constipation, nausea or vomiting  cough  drowsy  runny or stuffy nose    trouble sleeping This list may not describe all possible side effects. Call your doctor for medical advice about side effects. You may report side effects to FDA at 1-800-FDA-1088. Where should I keep my medicine? Keep out of the reach of children. Store at room temperature between 15 and 30 degrees C (59 and 86 degrees F). Throw away any unused medicine after the expiration date. NOTE: This sheet is a summary. It may not cover all possible information. If you have questions about this medicine, talk to your doctor, pharmacist, or health care provider.  2020 Elsevier/Gold Standard (2017-11-16 12:54:06)  

## 2019-10-09 NOTE — Progress Notes (Addendum)
10/09/19 12:07 PM   Chad Avila [birth year] [id number]  CC: Nocturia, urinary symptoms  HPI: I saw Mr. Chad Avila in urology clinic for evaluation of nocturia and urinary symptoms.  He is a 64 year old male with multiple year history of urinary complaints including urinary frequency, nocturia, and occasional weak stream.  He has had the symptoms for 3 to 4 years.  He will get up most nights 1-2 times at night, but can get up to 6 times a night.  He denies any gross hematuria, or history of UTIs.  He denies any family history of prostate cancer.  He has a 10-pack-year smoking history and quit in 1988.  He has a questionable history of prior prostatitis treated with antibiotics.  IPSS score today is 16, indicating moderate symptoms with quality of life mixed.  Urinalysis is benign today with 0-5 WBCs, 0 RBCs, no bacteria, nitrite negative.  PVR is elevated at 282 mL.  He did feel like he emptied completely.  He does have sleep apnea.  Most recent PSA and September 2020 was 3.37 which had increased from 1.31 in February 2017.   PMH: Past Medical History:  Diagnosis Date  . Chest pain 07/26/06   Stress myoview, NSST/TW changes, low risk  . Claustrophobia   . Degenerative joint disease    Mild, L 4/5, L 5/6 VIA X-Ray  . Epididymitis   . Gastritis   . Gilbert syndrome   . Helicobacter pylori (H. pylori)   . Hyperglycemia   . Hyperlipemia   . Hyperplastic colonic polyp   . Hypertension    induced post colonoscopy  . Hypothyroidism   . Organic impotence   . Shingles   . Sleep apnea     Surgical History: Past Surgical History:  Procedure Laterality Date  . COLONOSCOPY  03/22/2007   Benign polyps, Dr. Henrene Pastor, 10 yrs  . ETT  06/12/2000   Negative ([facility])  . FRACTURE SURGERY     Left leg, as a child  . herniorraphy  03/22/2011   Dr Bary Castilla  . HOSP     Gastritis in AF ([place], [place])  . STRESS MYOVIEW  07/26/2006   NSST/T W changes, low risk  . TONSILLECTOMY     As a  child  . VENTRAL HERNIA REPAIR     As a child     Family History: Family History  Problem Relation Age of Onset  . Arthritis Mother        Osteroporosis  . Diabetes Mother   . Hypertension Mother        Mother's side, strokes  . Heart disease Mother 21       MI  12/11  . Osteoporosis Mother   . Stroke Mother   . Memory loss Mother   . Heart disease Father   . Heart disease Paternal Grandfather        MI  . Hypertension Other        Mother's family  . Stroke Other        Mother's side  . Colon cancer Maternal Uncle        late 62's  . Prostate cancer Neg Hx   . Stomach cancer Neg Hx   . Esophageal cancer Neg Hx     Social History:  reports that he quit smoking about 33 years ago. He has never used smokeless tobacco. He reports current alcohol use of about 10.0 standard drinks of alcohol per week. He reports that he does not use drugs.  Physical Exam: BP (!) 180/104   Pulse 76   Ht 5\' 8"  (1.727 m)   Wt 165 lb (74.8 kg)   BMI 25.09 kg/m    Constitutional:  Alert and oriented, No acute distress. Cardiovascular: No clubbing, cyanosis, or edema. Respiratory: Normal respiratory effort, no increased work of breathing. GI: Abdomen is soft, nontender, nondistended, no abdominal masses GU: phallus without lesions, widely patent meatus DRE: 60 g, smooth, no nodules   Laboratory Data: Reviewed, see HPI  Assessment & Plan:   In summary, is a 64 year old male with frequency and nocturia and elevated PVR of 233ml in clinic today.  I suspect a lot of his urinary symptoms are related to incomplete bladder emptying, as well as sleep apnea contributing to nocturia.  I recommended a trial of silodosin to see if his urinary symptoms improve and PVR decreases.  Risks and benefits of this medication discussed.  We discussed the importance of close follow-up with a repeat PVR in 4 weeks and symptom check.  If he continues to have an elevated PVR or bothersome symptoms, would recommend  cystoscopy and rectal ultrasound to evaluate prostate anatomy and volume for consideration for possible outlet procedure.  Nickolas Madrid, MD 10/09/2019  [facility] [address] [place], [place] [address] [phone]

## 2019-11-14 ENCOUNTER — Other Ambulatory Visit: Payer: Self-pay

## 2019-11-14 ENCOUNTER — Ambulatory Visit (INDEPENDENT_AMBULATORY_CARE_PROVIDER_SITE_OTHER): Payer: No Typology Code available for payment source | Admitting: Urology

## 2019-11-14 VITALS — BP 159/97 | HR 60 | Ht 68.0 in | Wt 165.0 lb

## 2019-11-14 DIAGNOSIS — N401 Enlarged prostate with lower urinary tract symptoms: Secondary | ICD-10-CM | POA: Diagnosis not present

## 2019-11-14 DIAGNOSIS — R339 Retention of urine, unspecified: Secondary | ICD-10-CM

## 2019-11-14 DIAGNOSIS — N138 Other obstructive and reflux uropathy: Secondary | ICD-10-CM

## 2019-11-14 DIAGNOSIS — Z125 Encounter for screening for malignant neoplasm of prostate: Secondary | ICD-10-CM | POA: Diagnosis not present

## 2019-11-14 LAB — BLADDER SCAN AMB NON-IMAGING: Scan Result: 125

## 2019-11-14 NOTE — Patient Instructions (Signed)
Benign Prostatic Hyperplasia  Benign prostatic hyperplasia (BPH) is an enlarged prostate gland that is caused by the normal aging process and not by cancer. The prostate is a walnut-sized gland that is involved in the production of semen. It is located in front of the rectum and below the bladder. The bladder stores urine and the urethra is the tube that carries the urine out of the body. The prostate may get bigger as a man gets older. An enlarged prostate can press on the urethra. This can make it harder to pass urine. The build-up of urine in the bladder can cause infection. Back pressure and infection may progress to bladder damage and kidney (renal) failure. What are the causes? This condition is part of a normal aging process. However, not all men develop problems from this condition. If the prostate enlarges away from the urethra, urine flow will not be blocked. If it enlarges toward the urethra and compresses it, there will be problems passing urine. What increases the risk? This condition is more likely to develop in men over the age of 50 years. What are the signs or symptoms? Symptoms of this condition include:  Getting up often during the night to urinate.  Needing to urinate frequently during the day.  Difficulty starting urine flow.  Decrease in size and strength of your urine stream.  Leaking (dribbling) after urinating.  Inability to pass urine. This needs immediate treatment.  Inability to completely empty your bladder.  Pain when you pass urine. This is more common if there is also an infection.  Urinary tract infection (UTI). How is this diagnosed? This condition is diagnosed based on your medical history, a physical exam, and your symptoms. Tests will also be done, such as:  A post-void bladder scan. This measures any amount of urine that may remain in your bladder after you finish urinating.  A digital rectal exam. In a rectal exam, your health care provider  checks your prostate by putting a lubricated, gloved finger into your rectum to feel the back of your prostate gland. This exam detects the size of your gland and any abnormal lumps or growths.  An exam of your urine (urinalysis).  A prostate specific antigen (PSA) screening. This is a blood test used to screen for prostate cancer.  An ultrasound. This test uses sound waves to electronically produce a picture of your prostate gland. Your health care provider may refer you to a specialist in kidney and prostate diseases (urologist). How is this treated? Once symptoms begin, your health care provider will monitor your condition (active surveillance or watchful waiting). Treatment for this condition will depend on the severity of your condition. Treatment may include:  Observation and yearly exams. This may be the only treatment needed if your condition and symptoms are mild.  Medicines to relieve your symptoms, including: ? Medicines to shrink the prostate. ? Medicines to relax the muscle of the prostate.  Surgery in severe cases. Surgery may include: ? Prostatectomy. In this procedure, the prostate tissue is removed completely through an open incision or with a laparoscope or robotics. ? Transurethral resection of the prostate (TURP). In this procedure, a tool is inserted through the opening at the tip of the penis (urethra). It is used to cut away tissue of the inner core of the prostate. The pieces are removed through the same opening of the penis. This removes the blockage. ? Transurethral incision (TUIP). In this procedure, small cuts are made in the prostate. This lessens   the prostate's pressure on the urethra. ? Transurethral microwave thermotherapy (TUMT). This procedure uses microwaves to create heat. The heat destroys and removes a small amount of prostate tissue. ? Transurethral needle ablation (TUNA). This procedure uses radio frequencies to destroy and remove a small amount of  prostate tissue. ? Interstitial laser coagulation ([place]). This procedure uses a laser to destroy and remove a small amount of prostate tissue. ? Transurethral electrovaporization (TUVP). This procedure uses electrodes to destroy and remove a small amount of prostate tissue. ? Prostatic urethral lift. This procedure inserts an implant to push the lobes of the prostate away from the urethra. Follow these instructions at home:  Take over-the-counter and prescription medicines only as told by your health care provider.  Monitor your symptoms for any changes. Contact your health care provider with any changes.  Avoid drinking large amounts of liquid before going to bed or out in public.  Avoid or reduce how much caffeine or alcohol you drink.  Give yourself time when you urinate.  Keep all follow-up visits as told by your health care provider. This is important. Contact a health care provider if:  You have unexplained back pain.  Your symptoms do not get better with treatment.  You develop side effects from the medicine you are taking.  Your urine becomes very dark or has a bad smell.  Your lower abdomen becomes distended and you have trouble passing your urine. Get help right away if:  You have a fever or chills.  You suddenly cannot urinate.  You feel lightheaded, or very dizzy, or you faint.  There are large amounts of blood or clots in the urine.  Your urinary problems become hard to manage.  You develop moderate to severe low back or flank pain. The flank is the side of your body between the ribs and the hip. These symptoms may represent a serious problem that is an emergency. Do not wait to see if the symptoms will go away. Get medical help right away. Call your local emergency services (911 in the [place] Do not drive yourself to the hospital. Summary  Benign prostatic hyperplasia (BPH) is an enlarged prostate that is caused by the normal aging process and not by  cancer.  An enlarged prostate can press on the urethra. This can make it hard to pass urine.  This condition is part of a normal aging process and is more likely to develop in men over the age of 29 years.  Get help right away if you suddenly cannot urinate. This information is not intended to replace advice given to you by your health care provider. Make sure you discuss any questions you have with your health care provider. Document Revised: 05/08/2018 Document Reviewed: 07/18/2016 Elsevier Patient Education  2020 [employer].   Prostate Cancer Screening  Prostate cancer screening is a test that is done to check for the presence of prostate cancer in men. The prostate gland is a walnut-sized gland that is located below the bladder and in front of the rectum in males. The function of the prostate is to add fluid to semen during ejaculation. Prostate cancer is the second most common type of cancer in men. Who should have prostate cancer screening?  Screening recommendations vary based on age and other risk factors. Screening is recommended if:  You are older than age 45. If you are age 30-69, talk with your health care provider about your need for screening and how often screening should be done. Because  most prostate cancers are slow growing and will not cause death, screening is generally reserved in this age group for men who have a 10-15-year life expectancy.  You are younger than age 55, and you have these risk factors: ? Being a black male or a male of African descent. ? Having a father, brother, or uncle who has been diagnosed with prostate cancer. The risk is higher if your family member's cancer occurred at an early age. Screening is not recommended if:  You are younger than age 40.  You are between the ages of 40 and 54 and you have no risk factors.  You are 70 years of age or older. At this age, the risks that screening can cause are greater than the benefits that it may  provide. If you are at high risk for prostate cancer, your health care provider may recommend that you have screenings more often or that you start screening at a younger age. How is screening for prostate cancer done? The recommended prostate cancer screening test is a blood test called the prostate-specific antigen (PSA) test. PSA is a protein that is made in the prostate. As you age, your prostate naturally produces more PSA. Abnormally high PSA levels may be caused by:  Prostate cancer.  An enlarged prostate that is not caused by cancer (benign prostatic hyperplasia, BPH). This condition is very common in older men.  A prostate gland infection (prostatitis). Depending on the PSA results, you may need more tests, such as:  A physical exam to check the size of your prostate gland.  Blood and imaging tests.  A procedure to remove tissue samples from your prostate gland for testing (biopsy). What are the benefits of prostate cancer screening?  Screening can help to identify cancer at an early stage, before symptoms start and when the cancer can be treated more easily.  There is a small chance that screening may lower your risk of dying from prostate cancer. The chance is small because prostate cancer is a slow-growing cancer, and most men with prostate cancer die from a different cause. What are the risks of prostate cancer screening? The main risk of prostate cancer screening is diagnosing and treating prostate cancer that would never have caused any symptoms or problems. This is called overdiagnosisand overtreatment. PSA screening cannot tell you if your PSA is high due to cancer or a different cause. A prostate biopsy is the only procedure to diagnose prostate cancer. Even the results of a biopsy may not tell you if your cancer needs to be treated. Slow-growing prostate cancer may not need any treatment other than monitoring, so diagnosing and treating it may cause unnecessary stress or  other side effects. A prostate biopsy may also cause:  Infection or fever.  A false negative. This is a result that shows that you do not have prostate cancer when you actually do have prostate cancer. Questions to ask your health care provider  When should I start prostate cancer screening?  What is my risk for prostate cancer?  How often do I need screening?  What type of screening tests do I need?  How do I get my test results?  What do my results mean?  Do I need treatment? Where to find more information  The American Cancer Society: www.cancer.org  American Urological Association: www.auanet.org Contact a health care provider if:  You have difficulty urinating.  You have pain when you urinate or ejaculate.  You have blood in your urine or   semen.  You have pain in your back or in the area of your prostate. Summary  Prostate cancer is a common type of cancer in men. The prostate gland is located below the bladder and in front of the rectum. This gland adds fluid to semen during ejaculation.  Prostate cancer screening may identify cancer at an early stage, when the cancer can be treated more easily.  The prostate-specific antigen (PSA) test is the recommended screening test for prostate cancer.  Discuss the risks and benefits of prostate cancer screening with your health care provider. If you are age 81 or older, the risks that screening can cause are greater than the benefits that it may provide. This information is not intended to replace advice given to you by your health care provider. Make sure you discuss any questions you have with your health care provider. Document Revised: 01/24/2019 Document Reviewed: 01/24/2019 Elsevier Patient Education  [place] Laser Enucleation of the Prostate (HoLEP)  HoLEP is a treatment for men with benign prostatic hyperplasia (BPH). The laser surgery removed blockages of urine flow, and is done without any  incisions on the body.     What is HoLEP?  HoLEP is a type of laser surgery used to treat obstruction (blockage) of urine flow as a result of benign prostatic hyperplasia (BPH). In men with BPH, the prostate gland is not cancerous, but has become enlarged. An enlarged prostate can result in a number of urinary tract symptoms such as weak urinary stream, difficulty in starting urination, inability to urinate, frequent urination, or getting up at night to urinate.  HoLEP was developed in the 1990's as a more effective and less expensive surgical option for BPH, compared to other surgical options such as laser vaporization(PVP/greenlight laser), transurethral resection of the prostate(TURP), and open simple prostatectomy.   What happens during a HoLEP?  HoLEP requires general anesthesia ("asleep" throughout the procedure).   An antibiotic is given to reduce the risk of infection  A surgical instrument called a resectoscope is inserted through the urethra (the tube that carries urine from the bladder). The resectoscope has a camera that allows the surgeon to view the internal structure of the prostate gland, and to see where the incisions are being made during surgery.  The laser is inserted into the resectoscope and is used to enucleate (free up) the enlarged prostate tissue from the capsule (outer shell) and then to seal up any blood vessels. The tissue that has been removed is pushed back into the bladder.  A morcellator is placed through the resectoscope, and is used to suction out the prostate tissue that has been pushed into the bladder.  When the prostate tissue has been removed, the resectoscope is removed, and a foley catheter is placed to allow healing and drain the urine from the bladder.     What happens after a HoLEP?  More than 90% of patients go home the same day a few hours after surgery. Less than 10% will be admitted to the hospital overnight for observation to monitor the  urine, or if they have other medical problems.  Fluid is flushed through the catheter for about 1 hour after surgery to clear any blood from the urine. It is normal to have some blood in the urine after surgery. The need for blood transfusion is extremely rare.  Eating and drinking are permitted after the procedure once the patient has fully awakened from anesthesia.  The catheter is usually removed 2-3  days after surgery- the patient will come to clinic to have the catheter removed and make sure they can urinate on their own.  It is very important to drink lots of fluids after surgery for one week to keep the bladder flushed.  At first, there may be some burning with urination, but this typically improved within a few hours to days. Most patients do not have a significant amount of pain, and narcotic pain medications are rarely needed.  Symptoms of urinary frequency, urgency, and even leakage are NORMAL for the first few weeks after surgery as the bladder adjusts after having to work hard against blockage from the prostate for many years. This will improve, but can sometimes take several months.  The use of pelvic floor exercises (Kegel exercises) can help improve problems with urinary incontinence.   After catheter removal, patients will be seen at 6 weeks and 6 months for symptom check  No heavy lifting for at least 2-3 weeks after surgery, however patients can walk and do light activities the first day after surgery. Return to work time depends on occupation.    What are the advantages of HoLEP?  HoLEP has been studied in many different parts of the world and has been shown to be a safe and effective procedure. Although there are many types of BPH surgeries available, HoLEP offers a unique advantage in being able to remove a large amount of tissue without any incisions on the body, even in very large prostates, while decreasing the risk of bleeding and providing tissue for pathology (to  look for cancer). This decreases the need for blood transfusions during surgery, minimizes hospital stay, and reduces the risk of needing repeat treatment.  What are the side effects of HoLEP?  Temporary burning and bleeding during urination. Some blood may be seen in the urine for weeks after surgery and is part of the healing process.  Urinary incontinence (inability to control urine flow) is expected in all patients immediately after surgery and they should wear pads for the first few days/weeks. This typically improves over the course of several weeks. Performing Kegel exercises can help decrease leakage from stress maneuvers such as coughing, sneezing, or lifting. The rate of long term leakage is very low. Patients may also have leakage with urgency and this may be treated with medication. The risk of urge incontinence can be dependent on several factors including age, prostate size, symptoms, and other medical problems.  Retrograde ejaculation or "backwards ejaculation." In 75% of cases, the patient will not see any fluid during ejaculation after surgery.  Erectile function is generally not significantly affected.   What are the risks of HoLEP?  Injury to the urethra or development of scar tissue at a later date  Injury to the capsule of the prostate (typically treated with longer catheterization).  Injury to the bladder or ureteral orifices (where the urine from the kidney drains out)  Infection of the bladder, testes, or kidneys  Return of urinary obstruction at a later date requiring another operation (<2%)  Need for blood transfusion or re-operation due to bleeding  Failure to relieve all symptoms and/or need for prolonged catheterization after surgery  5-15% of patients are found to have previously undiagnosed prostate cancer in their specimen. Prostate cancer can be treated after HoLEP.  Standard risks of anesthesia including blood clots, heart attacks, etc  When should I  call my doctor?  Fever over 101.3 degrees  Inability to urinate, or large blood clots in the  urine

## 2019-11-14 NOTE — Progress Notes (Signed)
   11/14/2019 8:30 AM   Donivan Scull 11-13-1955 [id number]  Reason for visit: Follow up BPH and elevated PVR, PSA screening  HPI: I saw Mr. Silvernail back in urology clinic today for BPH and PSA screening.  I originally met him in April 2021 when he was having nocturia as many as 5-6 times per night, frequency, weak stream, and feeling of incomplete emptying.  His PVR was elevated at 280 mL at that visit and he felt like he was emptying completely.  He also has sleep apnea, and has been more compliant with CPAP over the last month.  At our last visit, we had started silodosin for his urinary symptoms and he reports a significant improvement.  PVR is decreased to 125 mL today, and IPSS score has improved to 10, with quality of life mostly satisfied.  His PSA also had increased to 3.37 in September 2020 from 1.3 the year prior.  DRE was normal at our last visit.  He is having some mild side effects from the silodosin of some "dopiness" in the morning, but this has improved over the last week or two.  We had another long conversation today about BPH and urinary symptoms.  We discussed that he is emptying much better, and we again reviewed the relationship between sleep apnea, CPAP, and nocturia.  We discussed alternatives including bladder outlet procedures including UroLift, TURP, and HOLEP and the need to better evaluate the prostate anatomy and volume prior to deciding on an outlet procedure if he elects to go that route.  Indications for an outlet procedure would be worsening urinary symptoms despite medical management, persistently elevated PVRs, or side effects from medications.  Regarding his PSA, I recommended repeating the PSA today secondary to the significant increase over the last year, despite the PSA still being in the normal range.  He again denies any family history of prostate or breast cancer.  We discussed the risks and benefits again of PSA screening, as well as potential need for a  prostate biopsy or MRI if PSA continues to rise.  Repeat PSA today, call with results Continue Silodosin RTC 6 months IPSS, PVR  Billey Co, MD  [facility] [address] [place], [place] [address] [phone]

## 2019-11-15 ENCOUNTER — Other Ambulatory Visit: Payer: Self-pay

## 2019-11-15 ENCOUNTER — Telehealth: Payer: Self-pay

## 2019-11-15 DIAGNOSIS — Z125 Encounter for screening for malignant neoplasm of prostate: Secondary | ICD-10-CM

## 2019-11-15 LAB — PSA TOTAL (REFLEX TO FREE): Prostate Specific Ag, Serum: 3.6 ng/mL (ref 0.0–4.0)

## 2019-11-15 NOTE — Telephone Encounter (Signed)
Called pt no answer. LM for pt per DPR informing him of the information below.

## 2019-11-15 NOTE — Telephone Encounter (Signed)
-----   Message from Billey Co, MD sent at 11/15/2019 10:28 AM EDT ----- PSA stable at 3.6. He has follow up in 6 months, please add PSA with reflex to free prior, thanks!  Nickolas Madrid, MD 11/15/2019

## 2019-11-19 ENCOUNTER — Encounter: Payer: Self-pay | Admitting: Family Medicine

## 2020-03-06 ENCOUNTER — Other Ambulatory Visit: Payer: Self-pay

## 2020-03-09 ENCOUNTER — Encounter: Payer: Self-pay | Admitting: Family Medicine

## 2020-05-22 ENCOUNTER — Other Ambulatory Visit: Payer: Self-pay

## 2020-05-27 ENCOUNTER — Ambulatory Visit: Payer: Self-pay | Admitting: Urology

## 2020-08-03 ENCOUNTER — Other Ambulatory Visit: Payer: Self-pay

## 2020-08-03 DIAGNOSIS — I10 Essential (primary) hypertension: Secondary | ICD-10-CM

## 2020-08-03 MED ORDER — ATORVASTATIN CALCIUM 10 MG PO TABS: 10.0000 mg | ORAL_TABLET | Freq: Every day | ORAL | 3 refills | Status: AC

## 2020-08-03 MED ORDER — LEVOTHYROXINE SODIUM 200 MCG PO TABS: ORAL_TABLET | ORAL | 3 refills | Status: AC

## 2020-08-03 MED ORDER — LISINOPRIL 40 MG PO TABS
40.0000 mg | ORAL_TABLET | Freq: Every day | ORAL | 3 refills | Status: AC
Start: 2020-08-03 — End: ?

## 2021-09-02 IMAGING — DX DG HIP (WITH OR WITHOUT PELVIS) 2-3V*R*
3 series · 3 of 3 positions shown · non-contrast
Comparison: No recent.

CLINICAL DATA: Hip pain.

EXAM:
DG HIP (WITH OR WITHOUT PELVIS) 2-3V RIGHT

[pelvis ap]
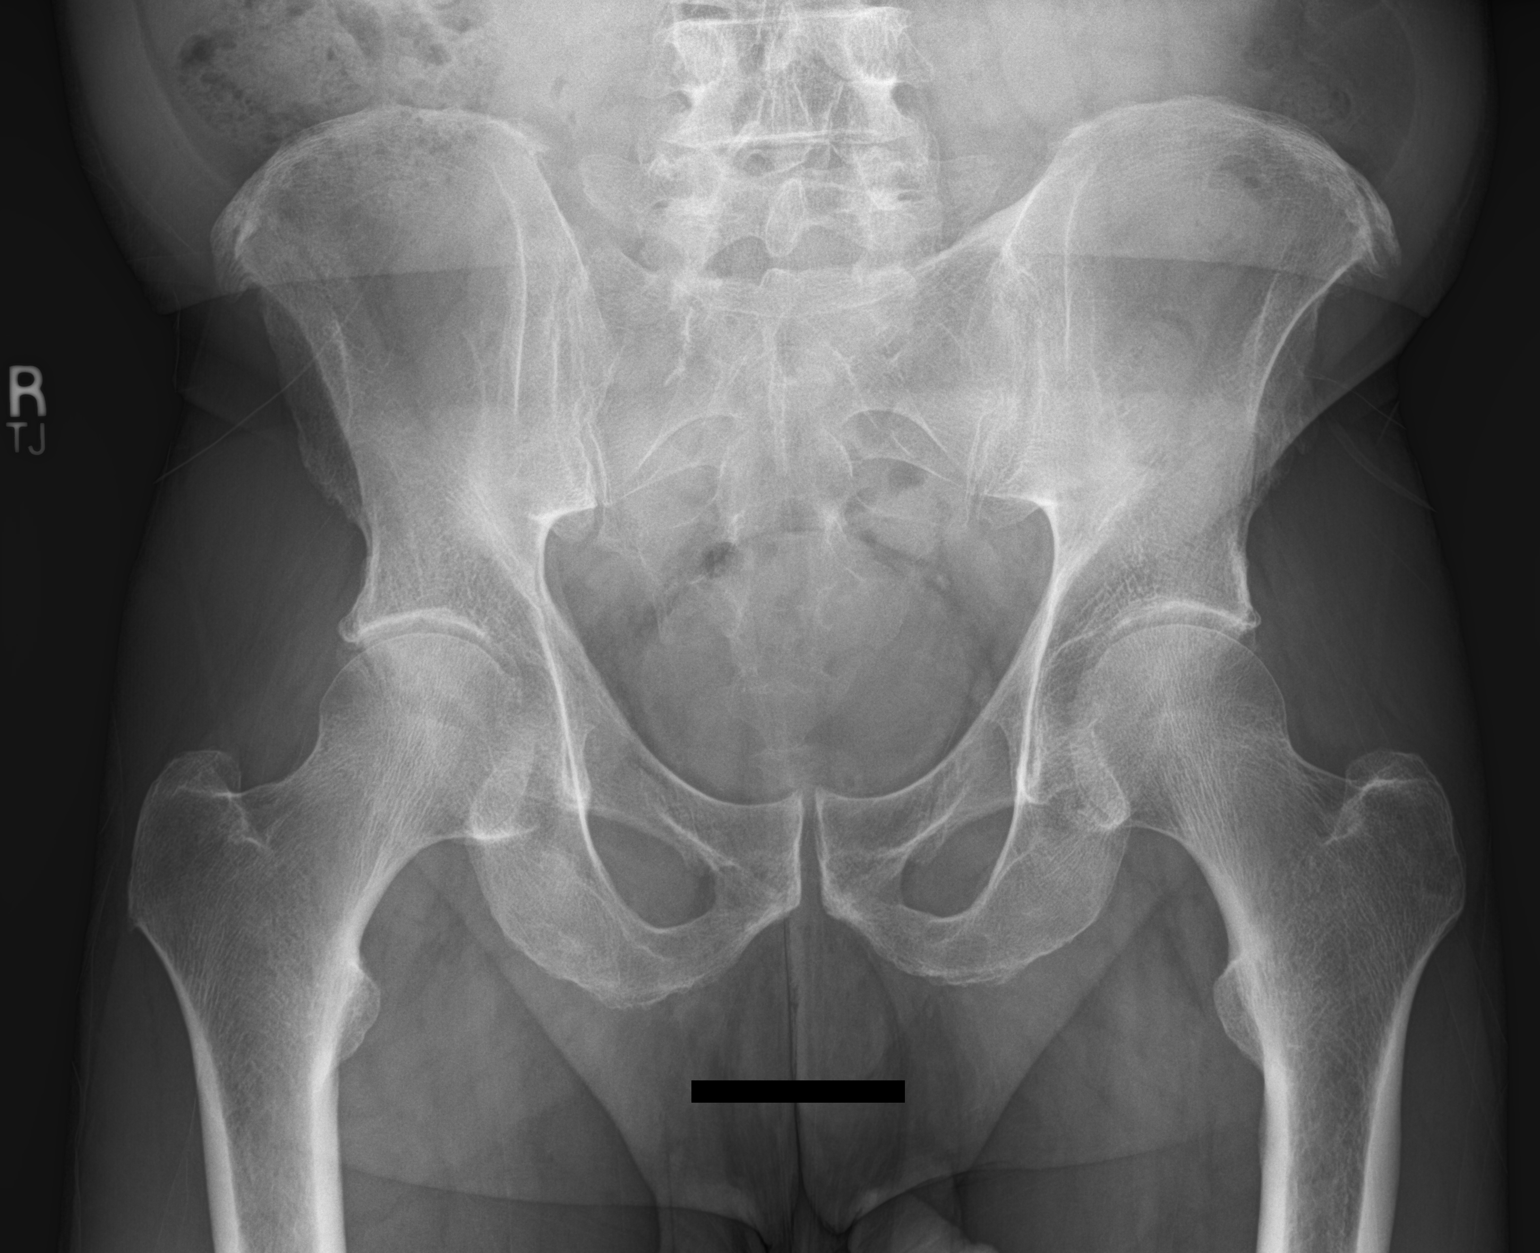

[hip ap]
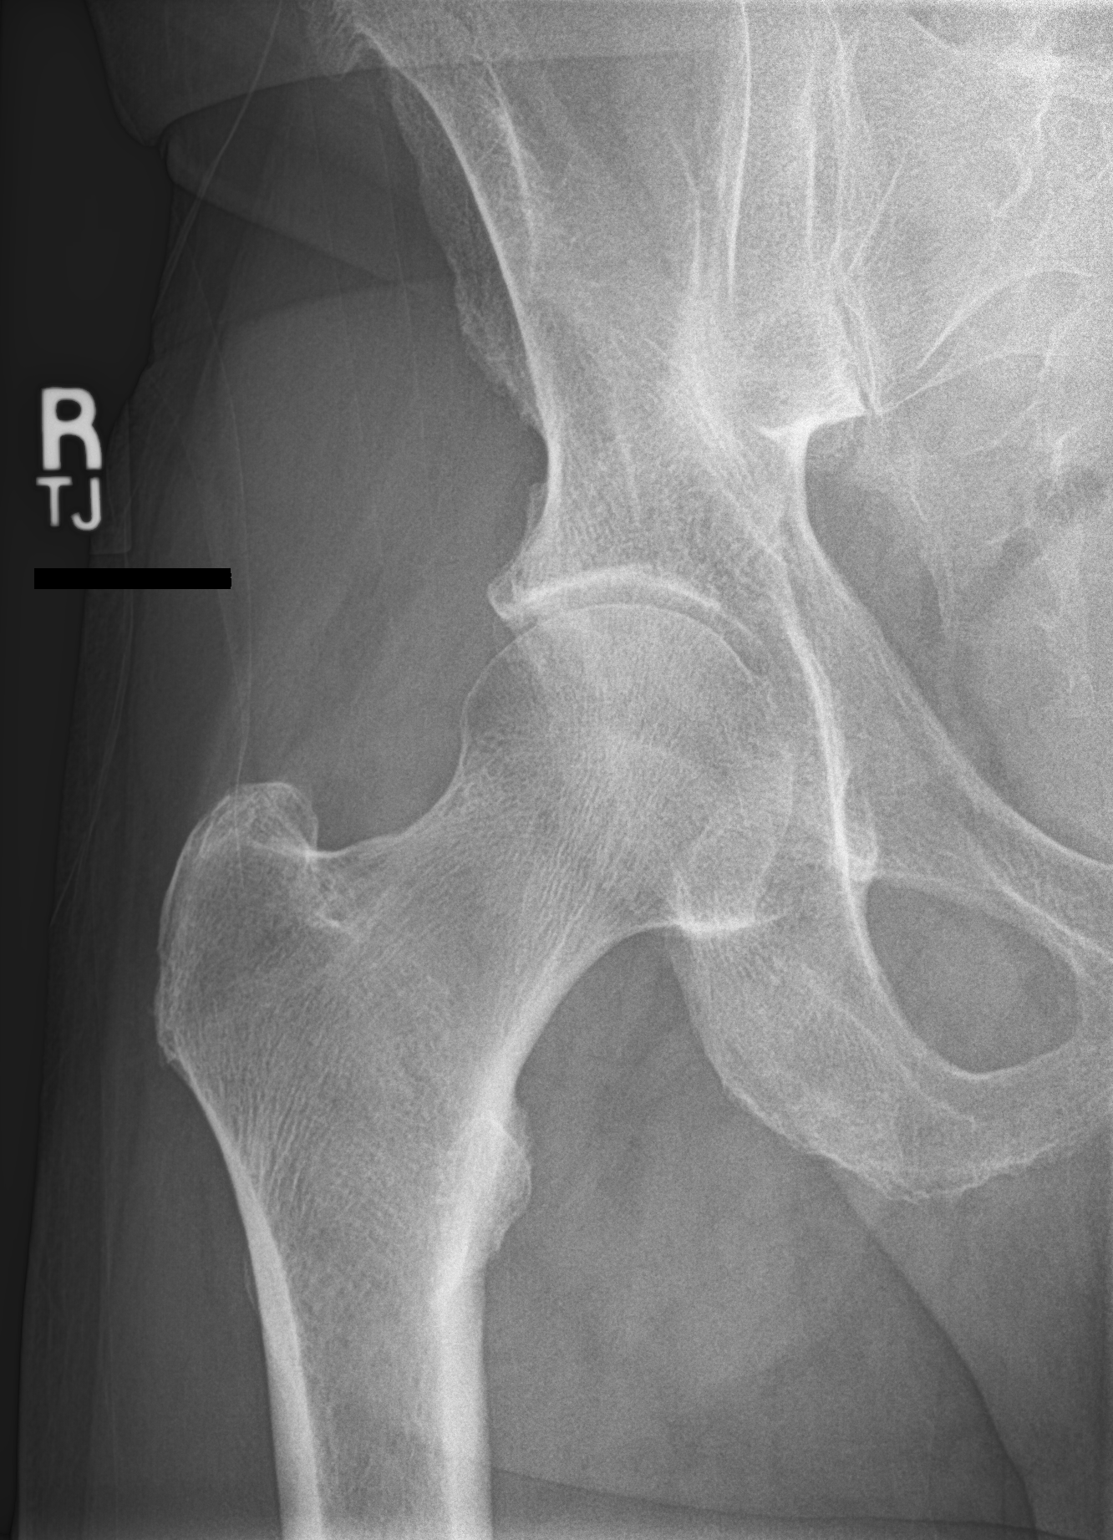

[hip lat]
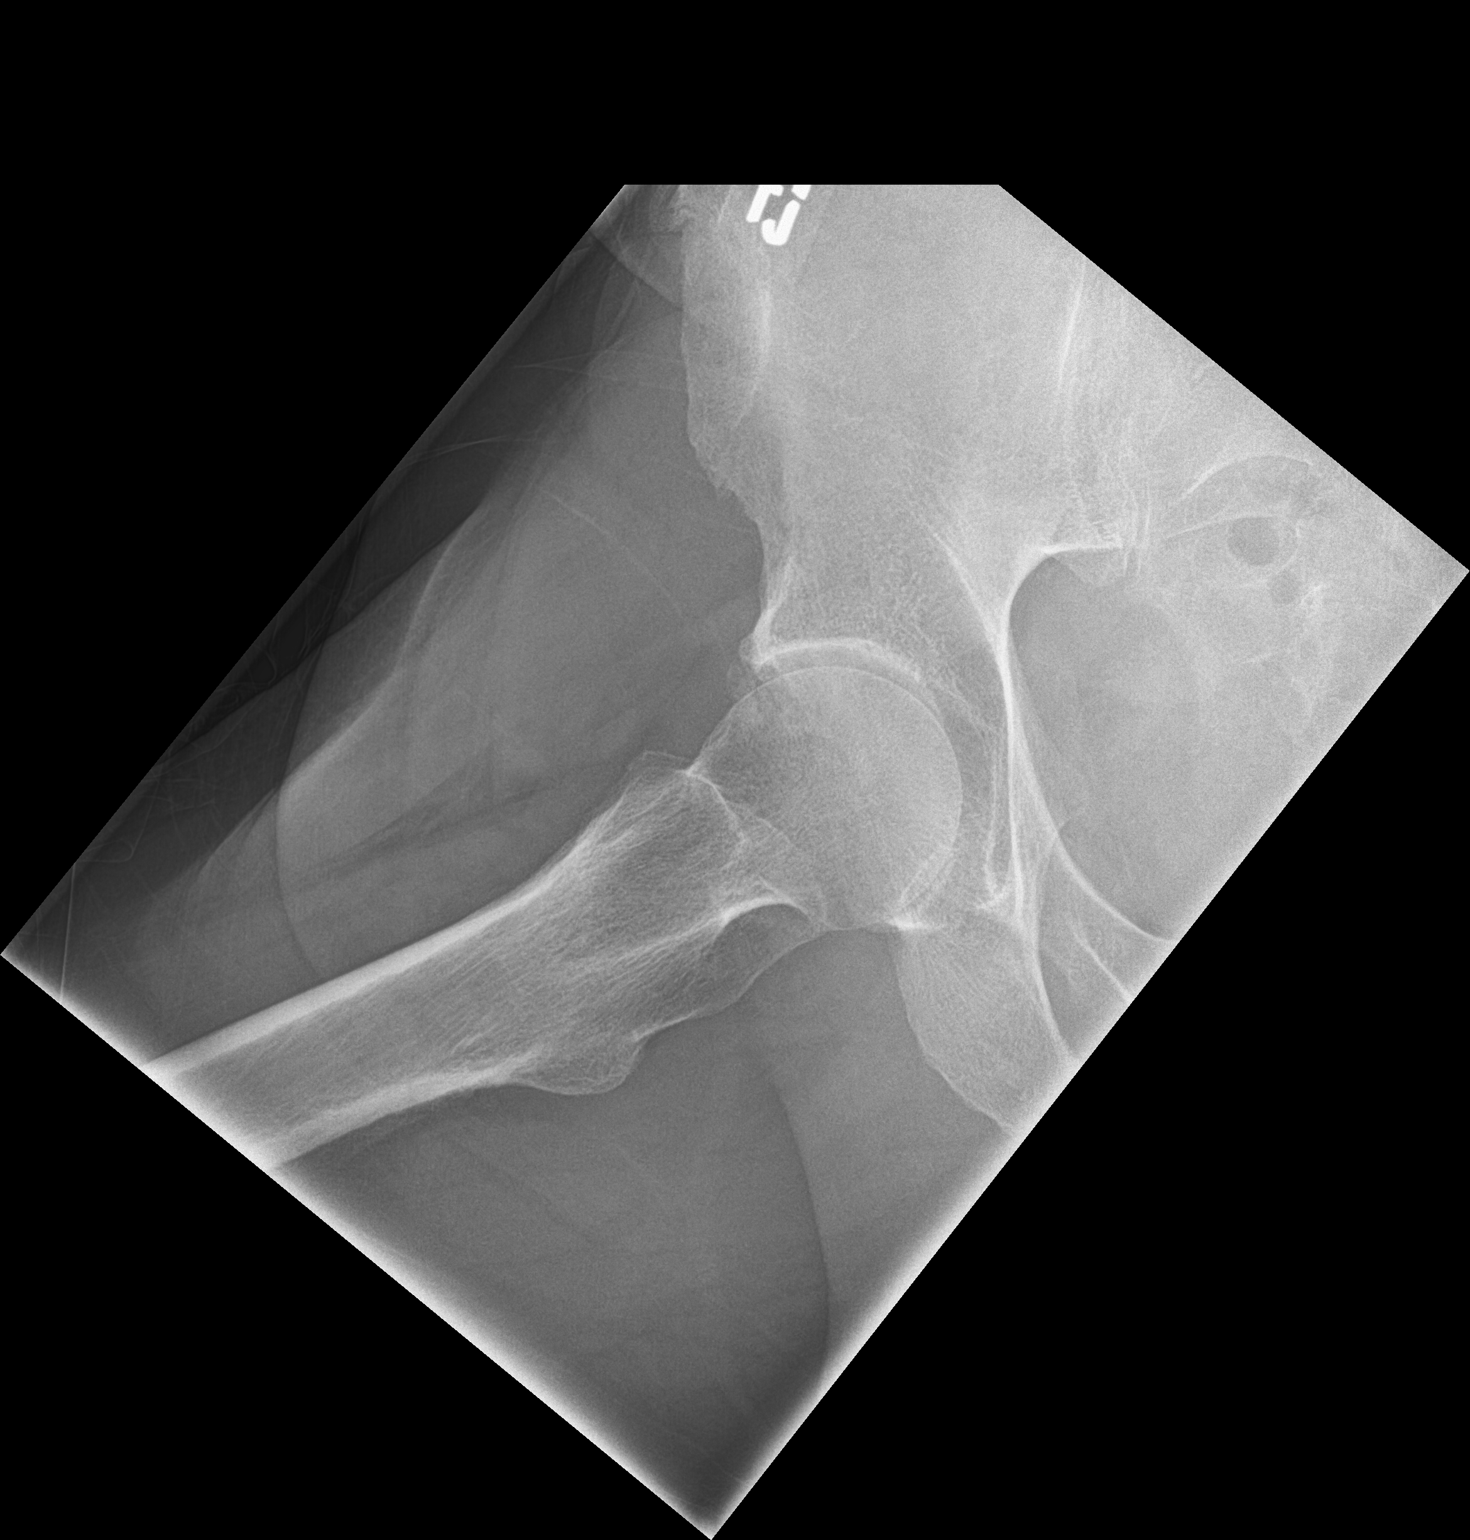

[3 of 3 positions shown; findings below may reference images not displayed]

FINDINGS: Degenerative change lumbar spine and both hips. Corticated bony
densities noted adjacent to the right acetabulum most likely
degenerative. No acute bony abnormality identified. No evidence of
fracture or dislocation.
IMPRESSION: Degenerative changes lumbar spine and both hips. No acute
abnormality identified.

## 2022-04-19 ENCOUNTER — Encounter: Payer: Self-pay | Admitting: Internal Medicine

## 2022-07-04 ENCOUNTER — Telehealth: Payer: Self-pay | Admitting: *Deleted

## 2022-07-04 NOTE — Telephone Encounter (Signed)
Transition Care Management Follow-up Telephone Call Date of discharge and from where: 07/04/2022 [facility] How have you been since you were released from the hospital? Is much better was put on bp meds  Any questions or concerns? No  Items Reviewed: Did the pt receive and understand the discharge instructions provided? Yes  Medications obtained and verified? Yes  Other? No  Any new allergies since your discharge? Yes  Dietary orders reviewed? Yes Do you have support at home? Yes    Functional Questionnaire: (I = Independent and D = Dependent) ADLs: i  Bathing/Dressing- i  Meal Prep- i  Eating- i  Maintaining continence- i  Transferring/Ambulation- i  Managing Meds- i  Follow up appointments reviewed:  PCP Hospital f/u appt confirmed?  Will call later this afternoon to set up an appt Are transportation arrangements needed? No  If their condition worsens, is the pt aware to call PCP or go to the Emergency Dept.? Yes Was the patient provided with contact information for the PCP's office or ED? Yes Was to pt encouraged to call back with questions or concerns? Yes

## 2022-07-06 ENCOUNTER — Telehealth: Payer: Self-pay

## 2022-07-06 NOTE — Telephone Encounter (Signed)
Not sure where the appt response came from but there was never any appts scheduled with you in the system. Do you want him to schedule a f/u appt?

## 2022-07-06 NOTE — Telephone Encounter (Signed)
Transition Care Management Follow-up Telephone Call Date of discharge and from where: [facility] 07/04/22 How have you been since you were released from the hospital? better Any questions or concerns? No  Items Reviewed: Did the pt receive and understand the discharge instructions provided? Yes  Medications obtained and verified? Yes  Other? No  Any new allergies since your discharge? No  Dietary orders reviewed? No Do you have support at home? Yes     Functional Questionnaire: (I = Independent and D = Dependent) ADLs: I  Bathing/Dressing- I  Meal Prep- I  Eating- I  Maintaining continence- I  Transferring/Ambulation- I  Managing Meds- I  Follow up appointments reviewed:  PCP Hospital f/u appt confirmed? Yes  Scheduled to see Dr.Duncan yesterday. Are transportation arrangements needed? No  If their condition worsens, is the pt aware to call PCP or go to the Emergency Dept.? Yes Was the patient provided with contact information for the PCP's office or ED? Yes Was to pt encouraged to call back with questions or concerns? Yes

## 2022-07-06 NOTE — Telephone Encounter (Signed)
Please check with patient.  I think he established elsewhere but please make sure (since we got this note).  Thanks.

## 2022-07-06 NOTE — Telephone Encounter (Signed)
Please check with patient.  I am always glad to see him.  Is he scheduled to come here and see me?  Either way I wish him the best and I hope he feels better soon.  Thanks.

## 2022-07-08 NOTE — Telephone Encounter (Signed)
Called and spoke with patient, he has established care with a provider in [place]. He has seen his PCP regarding this hospital follow up and has all the information. Dr. Damita Dunnings removed as his PCP. Nothing further needed a this time.
